# Patient Record
Sex: Female | Born: 1990 | Race: Black or African American | Hispanic: No | Marital: Single | Smoking: Current every day smoker
Health system: Southern US, Community
[De-identification: ages and names within clinical notes are randomized; demographics above are authoritative.]

## PROBLEM LIST (undated history)

## (undated) ENCOUNTER — Inpatient Hospital Stay (HOSPITAL_COMMUNITY): Payer: Self-pay

## (undated) DIAGNOSIS — Z789 Other specified health status: Secondary | ICD-10-CM

## (undated) DIAGNOSIS — Z98891 History of uterine scar from previous surgery: Secondary | ICD-10-CM

## (undated) HISTORY — PX: MOUTH SURGERY: SHX715

---
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
## 2011-09-21 ENCOUNTER — Emergency Department (HOSPITAL_COMMUNITY)
Admission: EM | Admit: 2011-09-21 | Discharge: 2011-09-22 | Disposition: A | Discharge status: Home / Self Care | Payer: BC Managed Care – PPO | Attending: Emergency Medicine | Admitting: Emergency Medicine

## 2011-09-21 ENCOUNTER — Encounter (HOSPITAL_COMMUNITY): Payer: Self-pay | Admitting: Emergency Medicine

## 2011-09-21 ENCOUNTER — Emergency Department (HOSPITAL_COMMUNITY): Payer: BC Managed Care – PPO

## 2011-09-21 DIAGNOSIS — S0003XA Contusion of scalp, initial encounter: Secondary | ICD-10-CM | POA: Insufficient documentation

## 2011-09-21 DIAGNOSIS — M79609 Pain in unspecified limb: Secondary | ICD-10-CM | POA: Insufficient documentation

## 2011-09-21 DIAGNOSIS — S60222A Contusion of left hand, initial encounter: Secondary | ICD-10-CM

## 2011-09-21 DIAGNOSIS — R51 Headache: Secondary | ICD-10-CM | POA: Insufficient documentation

## 2011-09-21 DIAGNOSIS — S0083XA Contusion of other part of head, initial encounter: Secondary | ICD-10-CM

## 2011-09-21 DIAGNOSIS — S60229A Contusion of unspecified hand, initial encounter: Secondary | ICD-10-CM | POA: Insufficient documentation

## 2011-09-21 DIAGNOSIS — F17200 Nicotine dependence, unspecified, uncomplicated: Secondary | ICD-10-CM | POA: Insufficient documentation
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
## 2011-09-21 MED ORDER — OXYCODONE-ACETAMINOPHEN 5-325 MG PO TABS
1.0000 | ORAL_TABLET | Freq: Once | ORAL | Status: AC
  Administered 2011-09-21: 1 via ORAL
  Filled 2011-09-21: qty 1

## 2011-09-21 MED ORDER — IBUPROFEN 200 MG PO TABS
400.0000 mg | ORAL_TABLET | Freq: Once | ORAL | Status: AC
  Administered 2011-09-21: 400 mg via ORAL
  Filled 2011-09-21: qty 2
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
## 2012-01-16 ENCOUNTER — Inpatient Hospital Stay (HOSPITAL_COMMUNITY)
Admission: AD | Admit: 2012-01-16 | Discharge: 2012-01-16 | Disposition: A | Discharge status: Home / Self Care | Payer: BC Managed Care – PPO | Source: Ambulatory Visit | Attending: Obstetrics and Gynecology | Admitting: Obstetrics and Gynecology

## 2012-01-16 ENCOUNTER — Encounter (HOSPITAL_COMMUNITY): Payer: Self-pay | Admitting: *Deleted

## 2012-01-16 DIAGNOSIS — O0930 Supervision of pregnancy with insufficient antenatal care, unspecified trimester: Secondary | ICD-10-CM | POA: Insufficient documentation

## 2012-01-16 DIAGNOSIS — O98819 Other maternal infectious and parasitic diseases complicating pregnancy, unspecified trimester: Secondary | ICD-10-CM | POA: Insufficient documentation

## 2012-01-16 DIAGNOSIS — Z331 Pregnant state, incidental: Secondary | ICD-10-CM

## 2012-01-16 DIAGNOSIS — A5901 Trichomonal vulvovaginitis: Secondary | ICD-10-CM | POA: Insufficient documentation

## 2012-01-16 DIAGNOSIS — R109 Unspecified abdominal pain: Secondary | ICD-10-CM | POA: Insufficient documentation

## 2012-01-16 HISTORY — DX: Other specified health status: Z78.9

## 2012-01-16 LAB — WET PREP, GENITAL: Clue Cells Wet Prep HPF POC: NONE SEEN

## 2012-01-16 LAB — URINALYSIS, ROUTINE W REFLEX MICROSCOPIC
Nitrite: NEGATIVE
Protein, ur: NEGATIVE mg/dL
Urobilinogen, UA: 0.2 mg/dL (ref 0.0–1.0)

## 2012-01-16 LAB — URINE MICROSCOPIC-ADD ON

## 2012-01-16 MED ORDER — METRONIDAZOLE 500 MG PO TABS
2000.0000 mg | ORAL_TABLET | Freq: Once | ORAL | Status: AC
  Administered 2012-01-16: 2000 mg via ORAL
  Filled 2012-01-16: qty 4

## 2012-01-16 MED ORDER — ONDANSETRON HCL 4 MG PO TABS
8.0000 mg | ORAL_TABLET | Freq: Once | ORAL | Status: AC
  Administered 2012-01-16: 8 mg via ORAL
  Filled 2012-01-16: qty 2
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
## 2012-01-16 NOTE — MAU Note (Signed)
Pt states, " While I was at work tonight I started having sharp pain in my lower back. I was cramping in my low abdomen but that pain has stopped."

## 2012-01-16 NOTE — MAU Note (Signed)
Pt in c/o lower abdominal back pains that are sharp and started around 6 pm.  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  Reports a clear discharge yesterday, none today.  Denies any bleeding.  Has 1st appt with Dr Senaida Ores this week.

## 2012-01-17 NOTE — MAU Provider Note (Signed)
Agree with above note.  Felicia Hicks 01/17/2012 7:46 AM   

## 2012-01-19 LAB — GC/CHLAMYDIA PROBE AMP, GENITAL: Chlamydia, DNA Probe: NEGATIVE

## 2012-02-09 LAB — OB RESULTS CONSOLE ANTIBODY SCREEN: Antibody Screen: NEGATIVE

## 2012-05-11 LAB — OB RESULTS CONSOLE GBS: GBS: NEGATIVE

## 2012-06-08 ENCOUNTER — Encounter (HOSPITAL_COMMUNITY): Payer: Self-pay | Admitting: Anesthesiology

## 2012-06-08 ENCOUNTER — Inpatient Hospital Stay (HOSPITAL_COMMUNITY)
Admission: AD | Admit: 2012-06-08 | Discharge: 2012-06-08 | Disposition: A | Discharge status: Home / Self Care | Payer: BC Managed Care – PPO | Source: Ambulatory Visit | Attending: Obstetrics and Gynecology | Admitting: Obstetrics and Gynecology

## 2012-06-08 ENCOUNTER — Encounter (HOSPITAL_COMMUNITY): Payer: Self-pay

## 2012-06-08 ENCOUNTER — Encounter (HOSPITAL_COMMUNITY): Payer: Self-pay | Admitting: *Deleted

## 2012-06-08 ENCOUNTER — Inpatient Hospital Stay (HOSPITAL_COMMUNITY)
Admission: AD | Admit: 2012-06-08 | Discharge: 2012-06-11 | DRG: 371 | Disposition: A | Discharge status: Home / Self Care | Payer: BC Managed Care – PPO | Source: Ambulatory Visit | Attending: Obstetrics and Gynecology | Admitting: Obstetrics and Gynecology

## 2012-06-08 DIAGNOSIS — O339 Maternal care for disproportion, unspecified: Secondary | ICD-10-CM | POA: Diagnosis present

## 2012-06-08 DIAGNOSIS — Z98891 History of uterine scar from previous surgery: Secondary | ICD-10-CM

## 2012-06-08 DIAGNOSIS — O479 False labor, unspecified: Secondary | ICD-10-CM | POA: Insufficient documentation

## 2012-06-08 DIAGNOSIS — O330 Maternal care for disproportion due to deformity of maternal pelvic bones: Principal | ICD-10-CM | POA: Diagnosis present

## 2012-06-08 HISTORY — DX: History of uterine scar from previous surgery: Z98.891

## 2012-06-08 LAB — TYPE AND SCREEN
ABO/RH(D): AB POS
Antibody Screen: NEGATIVE

## 2012-06-08 LAB — CBC
Platelets: 340 10*3/uL (ref 150–400)
RDW: 14.3 % (ref 11.5–15.5)
WBC: 25.9 10*3/uL — ABNORMAL HIGH (ref 4.0–10.5)

## 2012-06-08 MED ORDER — OXYCODONE-ACETAMINOPHEN 5-325 MG PO TABS: 1.0000 | ORAL_TABLET | ORAL | Status: DC | PRN

## 2012-06-08 MED ORDER — EPHEDRINE 5 MG/ML INJ
INTRAVENOUS | Status: AC
  Filled 2012-06-08: qty 4

## 2012-06-08 MED ORDER — CITRIC ACID-SODIUM CITRATE 334-500 MG/5ML PO SOLN
30.0000 mL | ORAL | Status: DC | PRN
  Administered 2012-06-09: 30 mL via ORAL
  Filled 2012-06-08: qty 15

## 2012-06-08 MED ORDER — OXYTOCIN 40 UNITS IN LACTATED RINGERS INFUSION - SIMPLE MED
1.0000 m[IU]/min | INTRAVENOUS | Status: DC
  Administered 2012-06-08: 1 m[IU]/min via INTRAVENOUS

## 2012-06-08 MED ORDER — FLEET ENEMA 7-19 GM/118ML RE ENEM: 1.0000 | ENEMA | RECTAL | Status: DC | PRN

## 2012-06-08 MED ORDER — PHENYLEPHRINE 40 MCG/ML (10ML) SYRINGE FOR IV PUSH (FOR BLOOD PRESSURE SUPPORT)
PREFILLED_SYRINGE | INTRAVENOUS | Status: AC
  Filled 2012-06-08: qty 5

## 2012-06-08 MED ORDER — EPHEDRINE 5 MG/ML INJ: 10.0000 mg | INTRAVENOUS | Status: DC | PRN

## 2012-06-08 MED ORDER — LIDOCAINE HCL (PF) 1 % IJ SOLN
INTRAMUSCULAR | Status: DC | PRN
  Administered 2012-06-08 (×2): 4 mL

## 2012-06-08 MED ORDER — OXYTOCIN 40 UNITS IN LACTATED RINGERS INFUSION - SIMPLE MED
62.5000 mL/h | INTRAVENOUS | Status: DC
  Filled 2012-06-08: qty 1000

## 2012-06-08 MED ORDER — LIDOCAINE HCL (PF) 1 % IJ SOLN
30.0000 mL | INTRAMUSCULAR | Status: DC | PRN
  Filled 2012-06-08: qty 30

## 2012-06-08 MED ORDER — FENTANYL 2.5 MCG/ML BUPIVACAINE 1/10 % EPIDURAL INFUSION (WH - ANES)
INTRAMUSCULAR | Status: AC
  Filled 2012-06-08: qty 125

## 2012-06-08 MED ORDER — LACTATED RINGERS IV SOLN: 500.0000 mL | INTRAVENOUS | Status: DC | PRN

## 2012-06-08 MED ORDER — PHENYLEPHRINE 40 MCG/ML (10ML) SYRINGE FOR IV PUSH (FOR BLOOD PRESSURE SUPPORT): 80.0000 ug | PREFILLED_SYRINGE | INTRAVENOUS | Status: DC | PRN

## 2012-06-08 MED ORDER — ONDANSETRON HCL 4 MG/2ML IJ SOLN
4.0000 mg | Freq: Four times a day (QID) | INTRAMUSCULAR | Status: DC | PRN
  Administered 2012-06-08: 4 mg via INTRAVENOUS
  Filled 2012-06-08: qty 2

## 2012-06-08 MED ORDER — ACETAMINOPHEN 325 MG PO TABS: 650.0000 mg | ORAL_TABLET | ORAL | Status: DC | PRN

## 2012-06-08 MED ORDER — IBUPROFEN 600 MG PO TABS: 600.0000 mg | ORAL_TABLET | Freq: Four times a day (QID) | ORAL | Status: DC | PRN

## 2012-06-08 MED ORDER — FENTANYL 2.5 MCG/ML BUPIVACAINE 1/10 % EPIDURAL INFUSION (WH - ANES)
INTRAMUSCULAR | Status: DC | PRN
  Administered 2012-06-08: 12 mL/h via EPIDURAL

## 2012-06-08 MED ORDER — LACTATED RINGERS IV SOLN
INTRAVENOUS | Status: DC
  Administered 2012-06-08 (×2): via INTRAVENOUS

## 2012-06-08 MED ORDER — OXYTOCIN BOLUS FROM INFUSION: 500.0000 mL | INTRAVENOUS | Status: DC

## 2012-06-08 MED ORDER — DIPHENHYDRAMINE HCL 50 MG/ML IJ SOLN: 12.5000 mg | INTRAMUSCULAR | Status: DC | PRN

## 2012-06-08 MED ORDER — FENTANYL 2.5 MCG/ML BUPIVACAINE 1/10 % EPIDURAL INFUSION (WH - ANES): 14.0000 mL/h | INTRAMUSCULAR | Status: DC

## 2012-06-08 MED ORDER — LACTATED RINGERS IV SOLN
500.0000 mL | Freq: Once | INTRAVENOUS | Status: AC
  Administered 2012-06-08: 500 mL via INTRAVENOUS

## 2012-06-08 MED ORDER — TERBUTALINE SULFATE 1 MG/ML IJ SOLN: 0.2500 mg | Freq: Once | INTRAMUSCULAR | Status: AC | PRN

## 2012-06-08 NOTE — Progress Notes (Signed)
Patient ID: Felicia Hicks, female   DOB: 05/21/1991, 21 y.o.   MRN: [id number]  Pt has quit contracting. The cervix is 5-6 cm dilated around a bulging BOW that ruptured spontaneously during the exam The fluid was meconium stained The vertex is at -2/-3 station The pelvis feels narrow. The FHR decelerated after ROM but recovered with positioning. Will start pitocin if contractions do not begin

## 2012-06-08 NOTE — Progress Notes (Signed)
Patient ID: Felicia Hicks, female   DOB: 04/21/1991, 21 y.o.   MRN: [id number] Pitocin at 19 mu/ minute and the cervix is almost fully dilated. There is only a small anterior lip and the vertex has descended to a 0 station. The pt has begun to feel some pressure.

## 2012-06-08 NOTE — Plan of Care (Signed)
Problem: Consults Goal: Birthing Suites Patient Information Press F2 to bring up selections list Outcome: Completed/Met Date Met:  06/08/12  Pt 37-[redacted] weeks EGA     

## 2012-06-08 NOTE — Anesthesia Procedure Notes (Signed)
Epidural Patient location during procedure: OB Start time: 06/08/2012 5:38 PM  Staffing Anesthesiologist: Acheron Sugg A. Performed by: anesthesiologist   Preanesthetic Checklist Completed: patient identified, site marked, surgical consent, pre-op evaluation, timeout performed, IV checked, risks and benefits discussed and monitors and equipment checked  Epidural Patient position: sitting Prep: site prepped and draped and DuraPrep Patient monitoring: continuous pulse ox and blood pressure Approach: midline Injection technique: LOR air  Needle:  Needle type: Tuohy  Needle gauge: 17 G Needle length: 9 cm and 9 Needle insertion depth: 8 cm Catheter type: closed end flexible Catheter size: 19 Gauge Catheter at skin depth: 14 cm Test dose: negative and Other  Assessment Events: blood not aspirated, injection not painful, no injection resistance, negative IV test and no paresthesia  Additional Notes Patient identified. Risks and benefits discussed including failed block, incomplete  Pain control, post dural puncture headache, nerve damage, paralysis, blood pressure Changes, nausea, vomiting, reactions to medications-both toxic and allergic and post Partum back pain. All questions were answered. Patient expressed understanding and wished to proceed. Sterile technique was used throughout procedure. Epidural site was Dressed with sterile barrier dressing. No paresthesias, signs of intravascular injection Or signs of intrathecal spread were encountered.  Patient was more comfortable after the epidural was dosed. Please see RN's note for documentation of vital signs and FHR which are stable.

## 2012-06-08 NOTE — Anesthesia Preprocedure Evaluation (Addendum)
Anesthesia Evaluation  Patient identified by MRN, date of birth, ID band Patient awake    Reviewed: Allergy & Precautions, H&P , Patient's Chart, lab work & pertinent test results  Airway Mallampati: III TM Distance: >3 FB Neck ROM: full    Dental No notable dental hx. (+) Teeth Intact   Pulmonary neg pulmonary ROS,  breath sounds clear to auscultation  Pulmonary exam normal       Cardiovascular + Orthopnea negative cardio ROS  Rhythm:regular Rate:Normal     Neuro/Psych negative neurological ROS  negative psych ROS   GI/Hepatic negative GI ROS, Neg liver ROS,   Endo/Other  Morbid obesity  Renal/GU negative Renal ROS  negative genitourinary   Musculoskeletal   Abdominal Normal abdominal exam  (+)   Peds  Hematology negative hematology ROS (+)   Anesthesia Other Findings   Reproductive/Obstetrics (+) Pregnancy                          Anesthesia Physical Anesthesia Plan  ASA: III  Anesthesia Plan: Epidural   Post-op Pain Management:    Induction:   Airway Management Planned:   Additional Equipment:   Intra-op Plan:   Post-operative Plan:   Informed Consent: I have reviewed the patients History and Physical, chart, labs and discussed the procedure including the risks, benefits and alternatives for the proposed anesthesia with the patient or authorized representative who has indicated his/her understanding and acceptance.   Dental advisory given  Plan Discussed with: Anesthesiologist  Anesthesia Plan Comments:        Anesthesia Quick Evaluation

## 2012-06-08 NOTE — MAU Note (Signed)
Pt states that contractions started around 0900 and have progressively gotten closer together and more painful. States some vaginal discharge as well.

## 2012-06-08 NOTE — Progress Notes (Signed)
Patient ID: Felicia Hicks, female   DOB: 21-May-1991, 21 y.o.   MRN: [id number] The pitocin is at 15 mu/ minute and her contractions are q 2-3 minutes. The cervix is 8 cm 90% effaced and the vertex is at - 2 station.

## 2012-06-08 NOTE — Progress Notes (Signed)
Patient ID: Felicia Hicks, female   DOB: 07-Jul-1990, 21 y.o.   MRN: [id number] Pt became uncomfortable with contractions and asked for and received an epidural. The cervix was thought to be 6.5 cm 80% effaced and the vertex at - 1 station by the RN at 16:55. Now, the pitocin is at 11 mu/ minute and the contractions are q 2-3 minutes The cervix is 7 cm 80 % effaced and the vertex is at - 2 station. Will follow for progressive dilatation and descent.

## 2012-06-08 NOTE — Progress Notes (Signed)
EFM has been adjusted.  Patient sits with UCs and traces maternal.

## 2012-06-08 NOTE — Progress Notes (Signed)
Patient ID: Felicia Hicks, female   DOB: May 25, 1991, 21 y.o.   MRN: [id number] The cervix is fully dilated. The vertex remains at 0 station. Have begun pushing and will check progress in 1 hour. If no progress will proceed to c section

## 2012-06-08 NOTE — MAU Note (Signed)
Pt states was here hours ago, since going home has not been able to sit down, feeling a lot of rectal pressure.

## 2012-06-08 NOTE — H&P (Signed)
Felicia Hicks, Felicia Hicks               ACCOUNT NO.:  [id number]  MEDICAL RECORD NO.:  [id number]  LOCATION:  9167                          FACILITY:  [facility]  PHYSICIAN:  Malachi Pro. Ambrose Mantle, M.D. DATE OF BIRTH:  30-May-1991  DATE OF ADMISSION:  06/08/2012 DATE OF DISCHARGE:  06/08/2012                             HISTORY & PHYSICAL   PRESENT ILLNESS:  This is a 21 year old black female, para 0, gravida 1, EDC June 15, 2012, based on an ultrasound at 19 weeks 2 days on January 22, 2012.  She is admitted in early labor.  She had been to the hospital during the night thinking she was in labor.  Cervix did not change.  She returned home, claims she was very uncomfortable, came back and was thought to be 5 cm dilated by the admitting nurse, presenting part was too high to tell the presentation with bulging membranes, but ultrasound on the site by the nurse practitioner showed the vertex to be presenting.  Blood group and type AB positive, negative antibody.  Pap smear normal.  Rubella immune.  RPR nonreactive.  Urine culture less than 10,000 mixed flora.  Hepatitis B surface antigen negative, HIV negative.  First trimester screen too advanced for screening. Hemoglobin AA, GC and Chlamydia negative.  Cystic fibrosis screening negative.  Quad screen negative.  One hour Glucola 91.  Group B strep negative.  The patient's prenatal course was essentially uncomplicated. Thoughts were given to inducing labor; however, the cervix remained somewhat unfavorable, so induction was not carried out.  PAST MEDICAL HISTORY:  She does have a history of trichomoniasis in July 2013.  She has had wisdom teeth extracted.  ALLERGIES:  She has no known drug allergies.  No latex allergy.  PREGNANCY HISTORY:  Has no pregnancy history.  She is active without formal exercise program.  Former drinker.  Works as a Conservation officer, nature at [employer]. Smoked 1-2 cigarettes a day but quit during the pregnancy.  PHYSICAL EXAMINATION:   VITAL SIGNS:  On admission, temperature 97.7, pulse 113, respirations 20, blood pressure 127/72. HEART:  Normal size and sounds.  No murmurs. LUNGS:  Clear to auscultation. PELVIC:  Fundal height at the last prenatal visit was 39 cm, per the RN exam the cervix is 5 cm dilated, a 100% effaced, vertex is high with bulging membranes.  The presenting part is confirmed as vertex by ultrasound by the nurse practitioner.  ADMITTING IMPRESSION:  Intrauterine pregnancy at 39 weeks, active labor. The patient is admitted for observation and management of labor.     Malachi Pro. Ambrose Mantle, M.D.     TFH/MEDQ  D:  06/08/2012  T:  06/08/2012  Job:  [id number]

## 2012-06-09 ENCOUNTER — Inpatient Hospital Stay (HOSPITAL_COMMUNITY): Payer: BC Managed Care – PPO | Admitting: Anesthesiology

## 2012-06-09 ENCOUNTER — Encounter (HOSPITAL_COMMUNITY): Payer: Self-pay | Admitting: *Deleted

## 2012-06-09 ENCOUNTER — Encounter (HOSPITAL_COMMUNITY)
Admission: AD | Disposition: A | Discharge status: Home / Self Care | Payer: Self-pay | Source: Ambulatory Visit | Attending: Obstetrics and Gynecology

## 2012-06-09 ENCOUNTER — Encounter (HOSPITAL_COMMUNITY): Payer: Self-pay | Admitting: Anesthesiology

## 2012-06-09 SURGERY — Surgical Case
Anesthesia: Epidural | Site: Abdomen | Wound class: Clean Contaminated

## 2012-06-09 MED ORDER — MEASLES, MUMPS & RUBELLA VAC ~~LOC~~ INJ
0.5000 mL | INJECTION | Freq: Once | SUBCUTANEOUS | Status: DC
  Filled 2012-06-09: qty 0.5

## 2012-06-09 MED ORDER — MEPERIDINE HCL 25 MG/ML IJ SOLN
INTRAMUSCULAR | Status: AC
  Filled 2012-06-09: qty 1

## 2012-06-09 MED ORDER — CEFAZOLIN SODIUM 10 G IJ SOLR
3.0000 g | Freq: Once | INTRAMUSCULAR | Status: AC
  Administered 2012-06-09: 3 g via INTRAVENOUS
  Filled 2012-06-09: qty 3000

## 2012-06-09 MED ORDER — NALOXONE HCL 1 MG/ML IJ SOLN
1.0000 ug/kg/h | INTRAVENOUS | Status: DC | PRN
  Filled 2012-06-09: qty 2

## 2012-06-09 MED ORDER — KETOROLAC TROMETHAMINE 30 MG/ML IJ SOLN: 30.0000 mg | Freq: Four times a day (QID) | INTRAMUSCULAR | Status: AC | PRN

## 2012-06-09 MED ORDER — NALOXONE HCL 0.4 MG/ML IJ SOLN: 0.4000 mg | INTRAMUSCULAR | Status: DC | PRN

## 2012-06-09 MED ORDER — ONDANSETRON HCL 4 MG/2ML IJ SOLN
INTRAMUSCULAR | Status: AC
  Filled 2012-06-09: qty 2

## 2012-06-09 MED ORDER — MORPHINE SULFATE (PF) 0.5 MG/ML IJ SOLN
INTRAMUSCULAR | Status: DC | PRN
  Administered 2012-06-09: 1 mg via INTRAVENOUS

## 2012-06-09 MED ORDER — OXYCODONE-ACETAMINOPHEN 5-325 MG PO TABS: 1.0000 | ORAL_TABLET | ORAL | Status: DC | PRN

## 2012-06-09 MED ORDER — KETOROLAC TROMETHAMINE 60 MG/2ML IM SOLN
INTRAMUSCULAR | Status: AC
  Filled 2012-06-09: qty 2

## 2012-06-09 MED ORDER — HYDROMORPHONE HCL PF 1 MG/ML IJ SOLN: 0.2500 mg | INTRAMUSCULAR | Status: DC | PRN

## 2012-06-09 MED ORDER — ONDANSETRON HCL 4 MG PO TABS: 4.0000 mg | ORAL_TABLET | ORAL | Status: DC | PRN

## 2012-06-09 MED ORDER — ONDANSETRON HCL 4 MG/2ML IJ SOLN: 4.0000 mg | INTRAMUSCULAR | Status: DC | PRN

## 2012-06-09 MED ORDER — LACTATED RINGERS IV SOLN
INTRAVENOUS | Status: DC
  Administered 2012-06-09: 16:00:00 via INTRAVENOUS

## 2012-06-09 MED ORDER — METOCLOPRAMIDE HCL 5 MG/ML IJ SOLN: 10.0000 mg | Freq: Three times a day (TID) | INTRAMUSCULAR | Status: DC | PRN

## 2012-06-09 MED ORDER — PRENATAL MULTIVITAMIN CH
1.0000 | ORAL_TABLET | Freq: Every day | ORAL | Status: DC
  Administered 2012-06-09 – 2012-06-11 (×3): 1 via ORAL
  Filled 2012-06-09 (×3): qty 1

## 2012-06-09 MED ORDER — LACTATED RINGERS IV SOLN
INTRAVENOUS | Status: DC | PRN
  Administered 2012-06-09: 02:00:00 via INTRAVENOUS

## 2012-06-09 MED ORDER — SIMETHICONE 80 MG PO CHEW: 80.0000 mg | CHEWABLE_TABLET | ORAL | Status: DC | PRN

## 2012-06-09 MED ORDER — SODIUM BICARBONATE 8.4 % IV SOLN
INTRAVENOUS | Status: AC
  Filled 2012-06-09: qty 50

## 2012-06-09 MED ORDER — CEFAZOLIN SODIUM-DEXTROSE 2-3 GM-% IV SOLR
2.0000 g | Freq: Three times a day (TID) | INTRAVENOUS | Status: AC
  Administered 2012-06-09 (×2): 2 g via INTRAVENOUS
  Filled 2012-06-09 (×2): qty 50

## 2012-06-09 MED ORDER — IBUPROFEN 600 MG PO TABS
600.0000 mg | ORAL_TABLET | Freq: Four times a day (QID) | ORAL | Status: DC | PRN
  Administered 2012-06-10: 600 mg via ORAL
  Filled 2012-06-09 (×4): qty 1

## 2012-06-09 MED ORDER — OXYTOCIN 10 UNIT/ML IJ SOLN
INTRAMUSCULAR | Status: AC
  Filled 2012-06-09: qty 4

## 2012-06-09 MED ORDER — DIPHENHYDRAMINE HCL 50 MG/ML IJ SOLN: 12.5000 mg | INTRAMUSCULAR | Status: DC | PRN

## 2012-06-09 MED ORDER — MEPERIDINE HCL 25 MG/ML IJ SOLN
6.2500 mg | INTRAMUSCULAR | Status: DC | PRN
  Administered 2012-06-09: 6.25 mg via INTRAVENOUS

## 2012-06-09 MED ORDER — 0.9 % SODIUM CHLORIDE (POUR BTL) OPTIME
TOPICAL | Status: DC | PRN
  Administered 2012-06-09: 1000 mL

## 2012-06-09 MED ORDER — SENNOSIDES-DOCUSATE SODIUM 8.6-50 MG PO TABS
2.0000 | ORAL_TABLET | Freq: Every day | ORAL | Status: DC
  Administered 2012-06-09 – 2012-06-10 (×2): 2 via ORAL

## 2012-06-09 MED ORDER — OXYTOCIN 40 UNITS IN LACTATED RINGERS INFUSION - SIMPLE MED: 62.5000 mL/h | INTRAVENOUS | Status: AC

## 2012-06-09 MED ORDER — DIBUCAINE 1 % RE OINT: 1.0000 "application " | TOPICAL_OINTMENT | RECTAL | Status: DC | PRN

## 2012-06-09 MED ORDER — MORPHINE SULFATE 0.5 MG/ML IJ SOLN
INTRAMUSCULAR | Status: AC
  Filled 2012-06-09: qty 10

## 2012-06-09 MED ORDER — TETANUS-DIPHTH-ACELL PERTUSSIS 5-2.5-18.5 LF-MCG/0.5 IM SUSP: 0.5000 mL | Freq: Once | INTRAMUSCULAR | Status: DC

## 2012-06-09 MED ORDER — DIPHENHYDRAMINE HCL 50 MG/ML IJ SOLN: 25.0000 mg | INTRAMUSCULAR | Status: DC | PRN

## 2012-06-09 MED ORDER — WITCH HAZEL-GLYCERIN EX PADS: 1.0000 "application " | MEDICATED_PAD | CUTANEOUS | Status: DC | PRN

## 2012-06-09 MED ORDER — MORPHINE SULFATE (PF) 0.5 MG/ML IJ SOLN
INTRAMUSCULAR | Status: DC | PRN
  Administered 2012-06-09: 4 mg via EPIDURAL

## 2012-06-09 MED ORDER — SCOPOLAMINE 1 MG/3DAYS TD PT72
1.0000 | MEDICATED_PATCH | Freq: Once | TRANSDERMAL | Status: DC
  Administered 2012-06-09: 1.5 mg via TRANSDERMAL

## 2012-06-09 MED ORDER — SCOPOLAMINE 1 MG/3DAYS TD PT72
MEDICATED_PATCH | TRANSDERMAL | Status: AC
  Filled 2012-06-09: qty 1

## 2012-06-09 MED ORDER — LANOLIN HYDROUS EX OINT: 1.0000 "application " | TOPICAL_OINTMENT | CUTANEOUS | Status: DC | PRN

## 2012-06-09 MED ORDER — SODIUM CHLORIDE 0.9 % IJ SOLN: 3.0000 mL | INTRAMUSCULAR | Status: DC | PRN

## 2012-06-09 MED ORDER — MEPERIDINE HCL 25 MG/ML IJ SOLN
INTRAMUSCULAR | Status: DC | PRN
  Administered 2012-06-09 (×2): 12.5 mg via INTRAVENOUS

## 2012-06-09 MED ORDER — KETOROLAC TROMETHAMINE 60 MG/2ML IM SOLN
60.0000 mg | Freq: Once | INTRAMUSCULAR | Status: AC | PRN
  Administered 2012-06-09: 60 mg via INTRAMUSCULAR

## 2012-06-09 MED ORDER — IBUPROFEN 600 MG PO TABS
600.0000 mg | ORAL_TABLET | Freq: Four times a day (QID) | ORAL | Status: DC
  Administered 2012-06-09 – 2012-06-11 (×8): 600 mg via ORAL
  Filled 2012-06-09 (×5): qty 1

## 2012-06-09 MED ORDER — MENTHOL 3 MG MT LOZG: 1.0000 | LOZENGE | OROMUCOSAL | Status: DC | PRN

## 2012-06-09 MED ORDER — SODIUM BICARBONATE 8.4 % IV SOLN
INTRAVENOUS | Status: DC | PRN
  Administered 2012-06-09 (×2): 5 mL via EPIDURAL

## 2012-06-09 MED ORDER — SIMETHICONE 80 MG PO CHEW
80.0000 mg | CHEWABLE_TABLET | Freq: Three times a day (TID) | ORAL | Status: DC
  Administered 2012-06-09 – 2012-06-11 (×10): 80 mg via ORAL

## 2012-06-09 MED ORDER — ZOLPIDEM TARTRATE 5 MG PO TABS: 5.0000 mg | ORAL_TABLET | Freq: Every evening | ORAL | Status: DC | PRN

## 2012-06-09 MED ORDER — DIPHENHYDRAMINE HCL 25 MG PO CAPS: 25.0000 mg | ORAL_CAPSULE | Freq: Four times a day (QID) | ORAL | Status: DC | PRN

## 2012-06-09 MED ORDER — LIDOCAINE-EPINEPHRINE (PF) 2 %-1:200000 IJ SOLN
INTRAMUSCULAR | Status: AC
  Filled 2012-06-09: qty 20

## 2012-06-09 MED ORDER — NALBUPHINE HCL 10 MG/ML IJ SOLN
5.0000 mg | INTRAMUSCULAR | Status: DC | PRN
  Filled 2012-06-09: qty 1

## 2012-06-09 MED ORDER — LACTATED RINGERS IV SOLN
INTRAVENOUS | Status: DC | PRN
  Administered 2012-06-09 (×3): via INTRAVENOUS

## 2012-06-09 MED ORDER — OXYTOCIN 10 UNIT/ML IJ SOLN
40.0000 [IU] | INTRAVENOUS | Status: DC | PRN
  Administered 2012-06-09: 40 [IU] via INTRAVENOUS

## 2012-06-09 MED ORDER — DIPHENHYDRAMINE HCL 25 MG PO CAPS: 25.0000 mg | ORAL_CAPSULE | ORAL | Status: DC | PRN

## 2012-06-09 MED ORDER — ONDANSETRON HCL 4 MG/2ML IJ SOLN
INTRAMUSCULAR | Status: DC | PRN
  Administered 2012-06-09: 4 mg via INTRAVENOUS

## 2012-06-09 MED ORDER — ONDANSETRON HCL 4 MG/2ML IJ SOLN: 4.0000 mg | Freq: Three times a day (TID) | INTRAMUSCULAR | Status: DC | PRN

## 2012-06-09 SURGICAL SUPPLY — 35 items
CLOTH BEACON ORANGE TIMEOUT ST (SAFETY) ×2 IMPLANT
CONTAINER PREFILL 10% NBF 15ML (MISCELLANEOUS) IMPLANT
DRAPE LG THREE QUARTER DISP (DRAPES) IMPLANT
DRESSING TELFA 8X3 (GAUZE/BANDAGES/DRESSINGS) IMPLANT
DRSG OPSITE POSTOP 4X10 (GAUZE/BANDAGES/DRESSINGS) ×2 IMPLANT
DRSG VASELINE 3X18 (GAUZE/BANDAGES/DRESSINGS) ×2 IMPLANT
DURAPREP 26ML APPLICATOR (WOUND CARE) ×2 IMPLANT
ELECT REM PT RETURN 9FT ADLT (ELECTROSURGICAL) ×2
ELECTRODE REM PT RTRN 9FT ADLT (ELECTROSURGICAL) ×1 IMPLANT
EXTRACTOR VACUUM KIWI (MISCELLANEOUS) IMPLANT
EXTRACTOR VACUUM M CUP 4 TUBE (SUCTIONS) IMPLANT
GAUZE SPONGE 4X4 12PLY STRL LF (GAUZE/BANDAGES/DRESSINGS) IMPLANT
GLOVE BIO SURGEON STRL SZ7.5 (GLOVE) ×4 IMPLANT
GOWN PREVENTION PLUS LG XLONG (DISPOSABLE) ×4 IMPLANT
GOWN PREVENTION PLUS XLARGE (GOWN DISPOSABLE) ×2 IMPLANT
KIT ABG SYR 3ML LUER SLIP (SYRINGE) IMPLANT
NEEDLE HYPO 25X5/8 SAFETYGLIDE (NEEDLE) IMPLANT
NS IRRIG 1000ML POUR BTL (IV SOLUTION) ×2 IMPLANT
PACK C SECTION WH (CUSTOM PROCEDURE TRAY) ×2 IMPLANT
PAD ABD 7.5X8 STRL (GAUZE/BANDAGES/DRESSINGS) IMPLANT
PAD OB MATERNITY 4.3X12.25 (PERSONAL CARE ITEMS) ×2 IMPLANT
RTRCTR C-SECT PINK 25CM LRG (MISCELLANEOUS) ×2 IMPLANT
SLEEVE SCD COMPRESS KNEE LRG (MISCELLANEOUS) ×2 IMPLANT
SLEEVE SCD COMPRESS KNEE MED (MISCELLANEOUS) IMPLANT
STAPLER VISISTAT 35W (STAPLE) ×2 IMPLANT
SUT PLAIN 0 NONE (SUTURE) IMPLANT
SUT VIC AB 0 CT1 36 (SUTURE) ×12 IMPLANT
SUT VIC AB 3-0 CTX 36 (SUTURE) ×2 IMPLANT
SUT VIC AB 3-0 SH 27 (SUTURE)
SUT VIC AB 3-0 SH 27X BRD (SUTURE) IMPLANT
SUT VIC AB 4-0 KS 27 (SUTURE) IMPLANT
SUT VICRYL 0 TIES 12 18 (SUTURE) IMPLANT
TOWEL OR 17X24 6PK STRL BLUE (TOWEL DISPOSABLE) ×6 IMPLANT
TRAY FOLEY CATH 14FR (SET/KITS/TRAYS/PACK) IMPLANT
WATER STERILE IRR 1000ML POUR (IV SOLUTION) ×2 IMPLANT

## 2012-06-09 NOTE — Progress Notes (Signed)
Patient ID: Felicia Hicks, female   DOB: 10/19/1990, 21 y.o.   MRN: [id number] DOS afebrile out OK but urine is concentrated.

## 2012-06-09 NOTE — Addendum Note (Signed)
Addendum  created 06/09/12 0953 by Elbert Ewings, CRNA   Modules edited:Charges VN

## 2012-06-09 NOTE — Addendum Note (Signed)
Addendum  created 06/09/12 2208 by Graciela Husbands, CRNA   Modules edited:Notes Section

## 2012-06-09 NOTE — Progress Notes (Signed)
Patient ID: Felicia Hicks, female   DOB: July 05, 1990, 21 y.o.   MRN: [id number] The exam is unchanged after an hour of pushing. Will proceed with c section for relative CPD.

## 2012-06-09 NOTE — Progress Notes (Signed)
C/S called. Pt educated; verbalized understanding; consent signed by pt and MD.

## 2012-06-09 NOTE — Addendum Note (Signed)
Addendum  created 06/09/12 1610 by Elbert Ewings, CRNA   Modules edited:Notes Section

## 2012-06-09 NOTE — Transfer of Care (Signed)
Immediate Anesthesia Transfer of Care Note  Patient: Felicia Hicks  Procedure(s) Performed: Procedure(s) (LRB) with comments: CESAREAN SECTION (N/A) - Primary Cesarean Section Delivery Baby Boy @ [phone], Apgars 8/9  Patient Location: PACU  Anesthesia Type:Epidural  Level of Consciousness: awake  Airway & Oxygen Therapy: Patient Spontanous Breathing  Post-op Assessment: Report given to PACU RN and Post -op Vital signs reviewed and stable  Post vital signs: stable  Complications: No apparent anesthesia complications

## 2012-06-09 NOTE — Op Note (Signed)
Operative note on Felicia Hicks:  Date of the operation: 06/09/2012  Preoperative diagnosis: Intrauterine pregnancy at term relative cephalopelvic disproportion  Postoperative diagnosis: Same  Operation: Low transverse cervical C-section  Operator: Shaylyn Bawa  Anesthesia: Dr. Cristela Blue  The patient was brought to the operating room and the epidural was boosted. The fetal heart tones were normal. A Foley catheter was inserted. The abdomen was prepped with a DuraPrep. A timeout was done.The abdomen was draped as a sterile field.anesthesia was confirmed by pinching the lower abdomen with an Allis clamp. A marking pen had been used to outline my incision in the skin before the abdomen was prepped. A transverse incision was made through the skin and subcutaneous tissue. There was some large bleeders in the subcutaneous tissue that were treated with the Bovie. Because of the patient's size, 5 feet 1 inches and 280 pounds the subcutaneous tissue was thick. I reached the fascia and incised transversely encountering a couple bleeders that were treated with the Bovie. The fascia was separated from the rectus muscle superiorly and inferiorly. The peritoneum was identified and opened sharply. It was stretched to allow the placement of a retractor,the Alexis retractor. I made a short transverse incision through the superficial layers of the myometrium in the lower uterine segment, then entered the amniotic sac with my finger releasing a large amount of meconium-stained fluid. The infant's vertex was elevated into the incisional opening without difficulty and the nose and pharynx were suctioned with the bulb.After the suctioning the female infant was given to the neonatologist Dr. Dorene Grebe who was in attendance.he assigned Apgars of 8 and 9 at one and 5 minutes. The placenta was removed. It was significantly calcified. The inside of the uterus was palpated and found to be free of debris. The uterus was quite boggy  and I held it between my hands for several minutes to try to get some tone along with Pitocin. The tubes and ovaries and uterus were normal. I closed the uterine incision with 2 running sutures of 0 Vicryl locking the first layer nonlocking on the second layer.liberal irrigation confirmed hemostasis. I removed the retractor and closed the abdominal wall in layers using interrupted sutures of 0 Vicryl to close the rectus muscle and peritoneum in one layer, 2 running sutures of 0 Vicryl on the fascia, a running 3-0 Vicryl on the subcutaneous tissue and staples on the skin.A sterile dressing was applied and the procedure was terminated estimated blood loss one thousand cc's

## 2012-06-09 NOTE — Anesthesia Postprocedure Evaluation (Signed)
  Anesthesia Post-op Note  Patient: Felicia Hicks  Procedure(s) Performed: Procedure(s) (LRB) with comments: CESAREAN SECTION (N/A) - Primary Cesarean Section Delivery Baby Boy @ [phone], Apgars 8/9  Patient Location: Mother/Baby  Anesthesia Type:Epidural  Level of Consciousness: awake, alert  and oriented  Airway and Oxygen Therapy: Patient Spontanous Breathing  Post-op Pain: mild  Post-op Assessment: Post-op Vital signs reviewed, Patient's Cardiovascular Status Stable, No headache, No backache, No residual numbness and No residual motor weakness  Post-op Vital Signs: Reviewed and stable  Complications: No apparent anesthesia complications

## 2012-06-09 NOTE — Anesthesia Postprocedure Evaluation (Signed)
  Anesthesia Post-op Note  Patient: Felicia Hicks  Procedure(s) Performed: Procedure(s) (LRB) with comments: CESAREAN SECTION (N/A) - Primary Cesarean Section Delivery Baby Boy @ [phone], Apgars 8/9   Patient is awake, responsive, moving her legs, and has signs of resolution of her numbness. Pain and nausea are reasonably well controlled. Vital signs are stable and clinically acceptable. Oxygen saturation is clinically acceptable. There are no apparent anesthetic complications at this time. Patient is ready for discharge.

## 2012-06-09 NOTE — Anesthesia Postprocedure Evaluation (Signed)
  Anesthesia Post-op Note  Patient: Felicia Hicks  Procedure(s) Performed: Procedure(s) (LRB) with comments: CESAREAN SECTION (N/A) - Primary Cesarean Section Delivery Baby Boy @ [phone], Apgars 8/9  Patient Location: PACU and Mother/Baby  Anesthesia Type:Epidural  Level of Consciousness: awake, alert  and oriented  Airway and Oxygen Therapy: Patient Spontanous Breathing  Post-op Pain: mild  Post-op Assessment: Patient's Cardiovascular Status Stable, Respiratory Function Stable, No signs of Nausea or vomiting, Adequate PO intake, Pain level controlled, No headache, No backache, No residual numbness and No residual motor weakness  Post-op Vital Signs: stable  Complications: No apparent anesthesia complications

## 2012-06-10 ENCOUNTER — Encounter (HOSPITAL_COMMUNITY): Payer: Self-pay | Admitting: Obstetrics and Gynecology

## 2012-06-10 LAB — CBC
HCT: 28.1 % — ABNORMAL LOW (ref 36.0–46.0)
Hemoglobin: 9 g/dL — ABNORMAL LOW (ref 12.0–15.0)
MCH: 27.9 pg (ref 26.0–34.0)
MCHC: 32 g/dL (ref 30.0–36.0)
MCV: 87 fL (ref 78.0–100.0)
Platelets: 278 K/uL (ref 150–400)
RBC: 3.23 MIL/uL — ABNORMAL LOW (ref 3.87–5.11)
RDW: 14.5 % (ref 11.5–15.5)
WBC: 21.1 K/uL — ABNORMAL HIGH (ref 4.0–10.5)

## 2012-06-10 NOTE — Progress Notes (Signed)
Patient ID: Felicia Hicks, female   DOB: 1991/06/21, 21 y.o.   MRN: [id number] #1 afebrile BP normal Passing flatus, voiding well, ambulating and tolerating a diet.

## 2012-06-11 ENCOUNTER — Encounter (HOSPITAL_COMMUNITY): Payer: Self-pay | Admitting: Obstetrics and Gynecology

## 2012-06-11 DIAGNOSIS — Z98891 History of uterine scar from previous surgery: Secondary | ICD-10-CM

## 2012-06-11 HISTORY — DX: History of uterine scar from previous surgery: Z98.891

## 2012-06-11 MED ORDER — PRENATAL MULTIVITAMIN CH: 1.0000 | ORAL_TABLET | Freq: Every day | ORAL | Status: DC

## 2012-06-11 MED ORDER — OXYCODONE-ACETAMINOPHEN 5-325 MG PO TABS: 1.0000 | ORAL_TABLET | ORAL | Status: DC | PRN

## 2012-06-11 MED ORDER — IBUPROFEN 800 MG PO TABS: 800.0000 mg | ORAL_TABLET | Freq: Three times a day (TID) | ORAL | Status: DC | PRN

## 2012-06-11 NOTE — Clinical Social Work Note (Signed)
CSW consulted with MOB.  No barriers to discharge at this time.  Full consult report to follow.    [phone] 

## 2012-06-11 NOTE — Progress Notes (Signed)
Subjective: Postpartum Day 2: Cesarean Delivery Patient reports incisional pain, tolerating PO and no problems voiding.  Pain controlled, nl lochia  Objective: Vital signs in last 24 hours: Temp:  [97.6 F (36.4 C)-98 F (36.7 C)] 97.7 F (36.5 C) (12/21 0630) Pulse Rate:  [96-99] 96  (12/21 0630) Resp:  [18-20] 18  (12/21 0630) BP: (121-139)/(78-85) 123/78 mmHg (12/21 0630)  Physical Exam:  General: alert and no distress Lochia: appropriate Uterine Fundus: firm Incision: healing well DVT Evaluation: No evidence of DVT seen on physical exam.   Basename 06/10/12 0001 06/08/12 0930  HGB 9.0* 11.4*  HCT 28.1* 35.5*    Assessment/Plan: Status post Cesarean section. Doing well postoperatively.  Discharge home with standard precautions and return to office Thursday for staple removal.  D/C with motrin, percocet, pnv.  BOVARD,Aleighna Wojtas 06/11/2012, 8:37 AM

## 2012-06-11 NOTE — Discharge Summary (Signed)
Obstetric Discharge Summary Reason for Admission: onset of labor Prenatal Procedures: none Intrapartum Procedures: cesarean: low cervical, transverse, CPD Postpartum Procedures: none Complications-Operative and Postpartum: none Hemoglobin  Date Value Range Status  06/10/2012 9.0* 12.0 - 15.0 g/dL Final     DELTA CHECK NOTED     REPEATED TO VERIFY     HCT  Date Value Range Status  06/10/2012 28.1* 36.0 - 46.0 % Final    Physical Exam:  General: alert and no distress Lochia: appropriate Uterine Fundus: firm Incision: healing well DVT Evaluation: No evidence of DVT seen on physical exam.  Discharge Diagnoses: Term Pregnancy-delivered  Discharge Information: Date: 06/11/2012 Activity: pelvic rest Diet: routine Medications: PNV, Ibuprofen and Percocet Condition: stable Instructions: refer to practice specific booklet Discharge to: home Follow-up Information    Schedule an appointment as soon as possible for a visit with Bing Plume, MD. (Thursday Dec 26th - for staple removal)    Contact information:   [address], [address], [address] [place] [address] [phone]          Newborn Data: Live born female  Birth Weight: 7 lb 6.2 oz (3350 g) APGAR: 8, 9  Home with mother.  Felicia Hicks,Felicia Hicks 06/11/2012, 8:43 AM

## 2012-06-12 NOTE — Clinical Social Work Note (Signed)
LATE ENTRY  Clinical Social Work Department PSYCHOSOCIAL ASSESSMENT - MATERNAL/CHILD 06/12/2012  Patient:  Felicia Hicks, Felicia Hicks  Account Number:  [id number]  Admit Date:  06/08/2012  Marjo Bicker Name:   Felicia Hicks    Clinical Social Worker:  Truman Hayward, LCSW   Date/Time:  06/11/2012 09:30 AM  Date Referred:  06/11/2012   Referral source  Physician  RN     Referred reason  Substance Abuse   Other referral source:    I:  FAMILY / HOME ENVIRONMENT Child's legal guardian:  PARENT  Guardian - Name Guardian - Age Guardian - Address  Felicia Hicks [address] [place] [address]  Felicia Hicks     Other household support members/support persons Name Relationship DOB  none     Other support:    II  PSYCHOSOCIAL DATA Information Source:  Patient Interview  Insurance claims handler Resources Employment:   MOB and FOB both employed   Surveyor, quantity resources:  [employer] If [employer] - County:  [employer] / Grade:   Maternity Care Coordinator / Child Services Coordination / Early Interventions:  Cultural issues impacting care:    III  STRENGTHS Strengths  Adequate Resources  Home prepared for Child (including basic supplies)  Supportive family/friends   Strength comment:    IV  RISK FACTORS AND CURRENT PROBLEMS Current Problem:  None   Risk Factor & Current Problem Patient Issue Family Issue Risk Factor / Current Problem Comment   N N     V  SOCIAL WORK ASSESSMENT CSW spoke with MOB and FOB in room.  MOB reports using MJ during pregnancy, however she has not for the last few months.  MOB stated this was due to nausea and is not something she did before the pregnancy.  CSW discussed hospital policy to drug screen and reported UDS results were negative.  CSW explained MEC results and possible follow up from DSS.  MOB was understanding.  CSW discussed supplies and family support.  MOB stated she had no concerns at this time.  Please reconsult CSW if further needs  arise.      VI SOCIAL WORK PLAN Social Work Plan  No Further Intervention Required / No Barriers to Discharge   Type of pt/family education:   If child protective services report - county:   If child protective services report - date:   Information/referral to community resources comment:   Other social work plan:

## 2014-04-23 ENCOUNTER — Encounter (HOSPITAL_COMMUNITY): Payer: Self-pay | Admitting: Obstetrics and Gynecology

## 2014-10-22 LAB — OB RESULTS CONSOLE VARICELLA ZOSTER ANTIBODY, IGG: Varicella: NON-IMMUNE/NOT IMMUNE

## 2014-10-22 LAB — OB RESULTS CONSOLE ANTIBODY SCREEN: ANTIBODY SCREEN: NEGATIVE

## 2014-10-22 LAB — OB RESULTS CONSOLE ABO/RH: RH Type: POSITIVE

## 2014-10-22 LAB — OB RESULTS CONSOLE RPR: RPR: NONREACTIVE

## 2014-11-20 ENCOUNTER — Other Ambulatory Visit (HOSPITAL_COMMUNITY): Payer: Self-pay | Admitting: Urology

## 2014-11-20 DIAGNOSIS — Z3689 Encounter for other specified antenatal screening: Secondary | ICD-10-CM

## 2014-11-26 ENCOUNTER — Other Ambulatory Visit (HOSPITAL_COMMUNITY): Payer: Self-pay | Admitting: Urology

## 2014-11-26 ENCOUNTER — Ambulatory Visit (HOSPITAL_COMMUNITY)
Admission: RE | Admit: 2014-11-26 | Discharge: 2014-11-26 | Disposition: A | Discharge status: Home / Self Care | Payer: BC Managed Care – PPO | Source: Ambulatory Visit | Attending: Urology | Admitting: Urology

## 2014-11-26 DIAGNOSIS — O99213 Obesity complicating pregnancy, third trimester: Secondary | ICD-10-CM | POA: Insufficient documentation

## 2014-11-26 DIAGNOSIS — Z3689 Encounter for other specified antenatal screening: Secondary | ICD-10-CM

## 2014-11-26 DIAGNOSIS — E669 Obesity, unspecified: Secondary | ICD-10-CM | POA: Diagnosis not present

## 2014-11-26 IMAGING — US US OB DETAIL+14 WK
1 series · 12 of 28 positions shown · non-contrast
Comparison: none

[Series 1: us ob +14 all · 118 acquisitions, 12 frames shown]
[im 5/118]
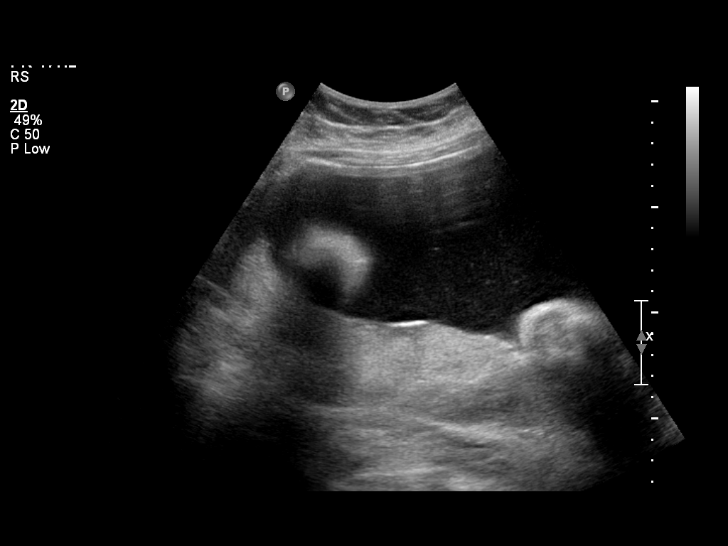
[im 14/118]
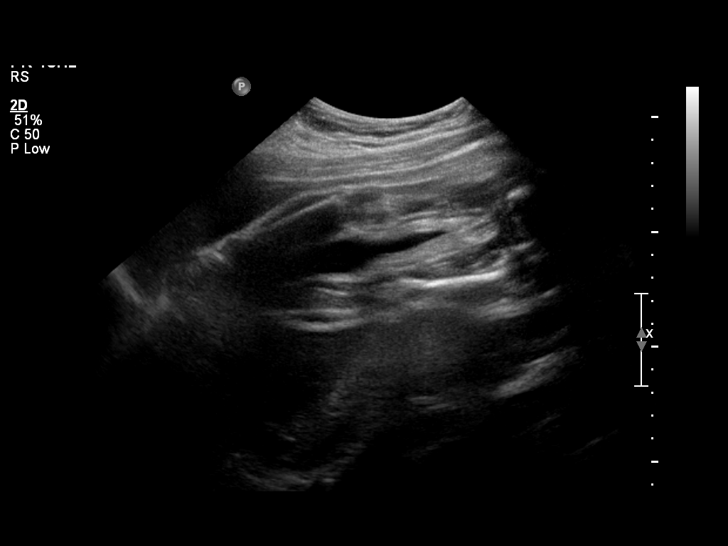
[im 22/118]
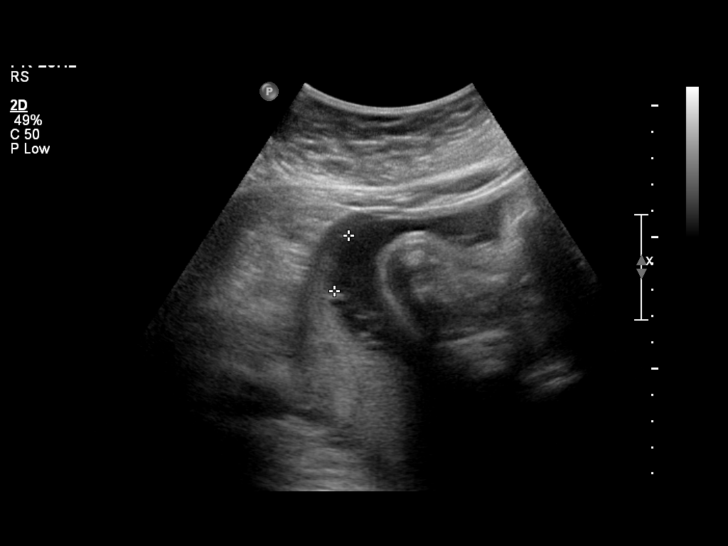
[im 35/118]
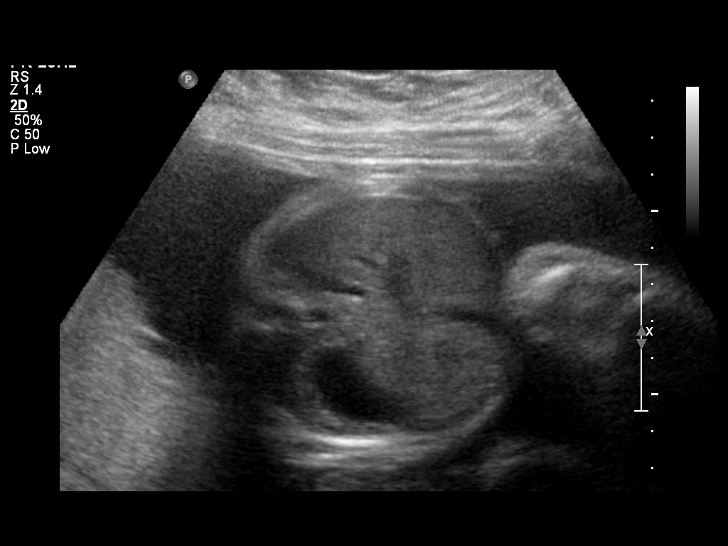
[im 44/118]
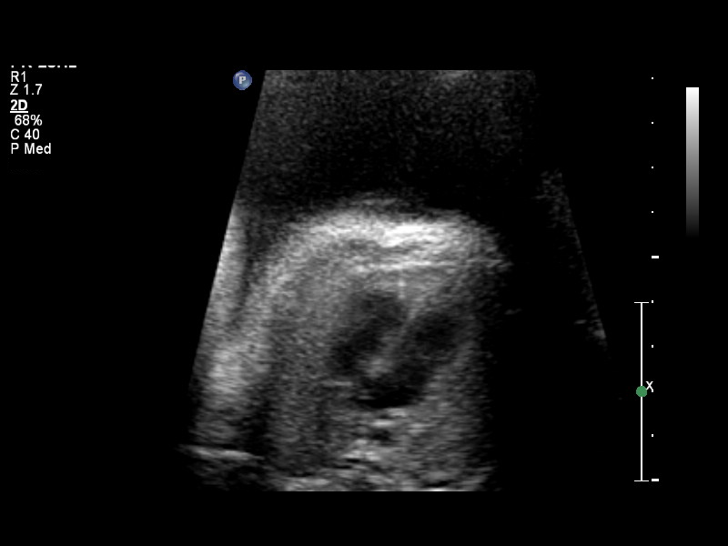
[im 53/118]
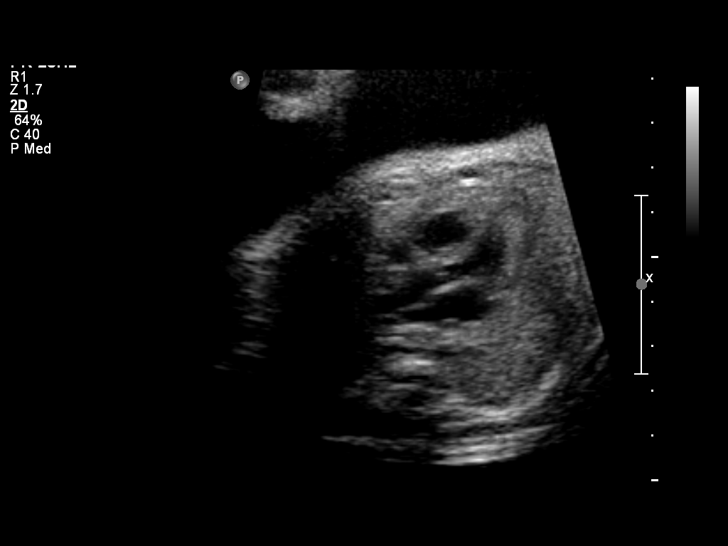
[im 66/118]
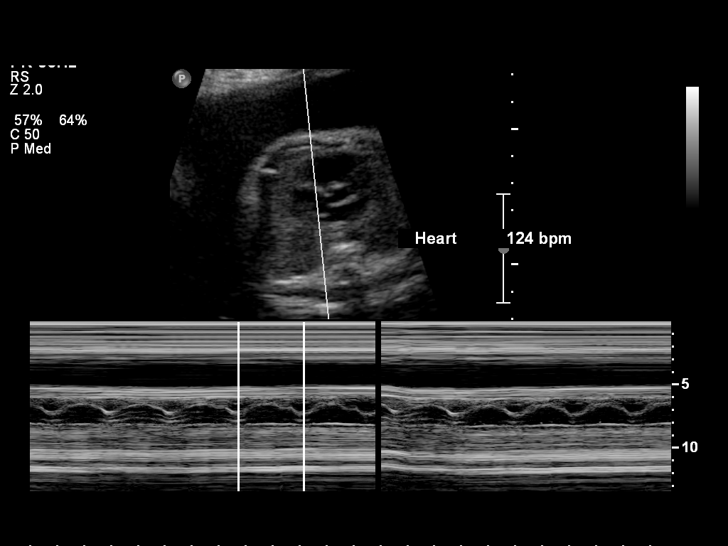
[im 74/118]
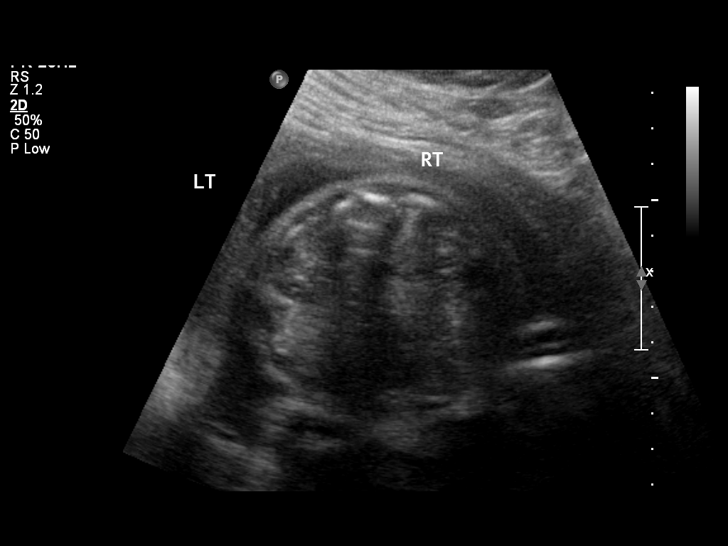
[im 83/118]
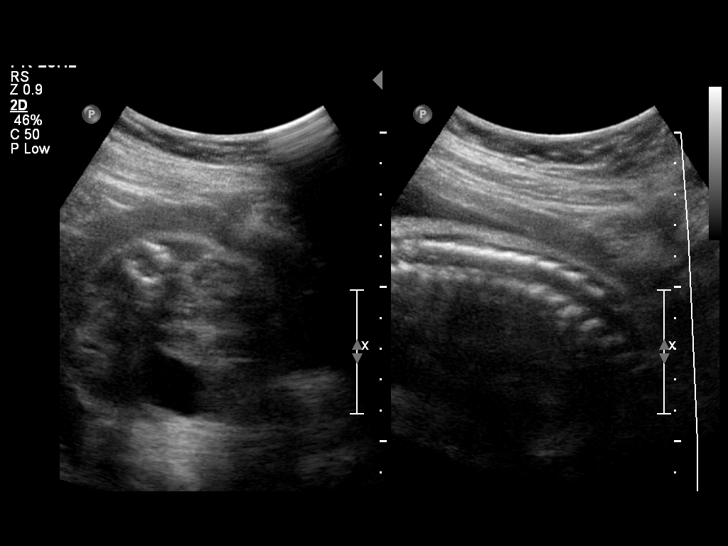
[im 96/118]
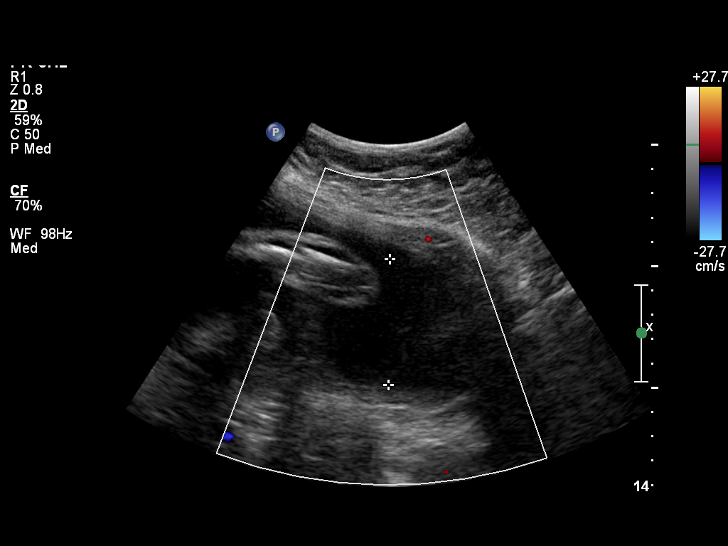
[im 105/118]
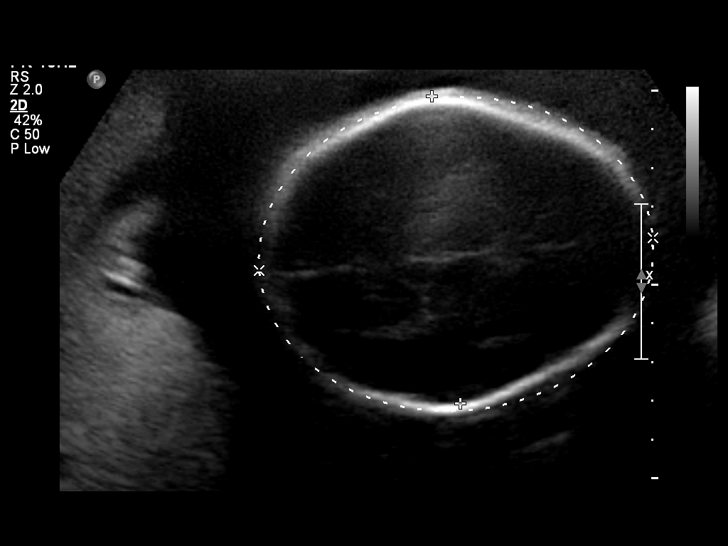
[im 113/118]
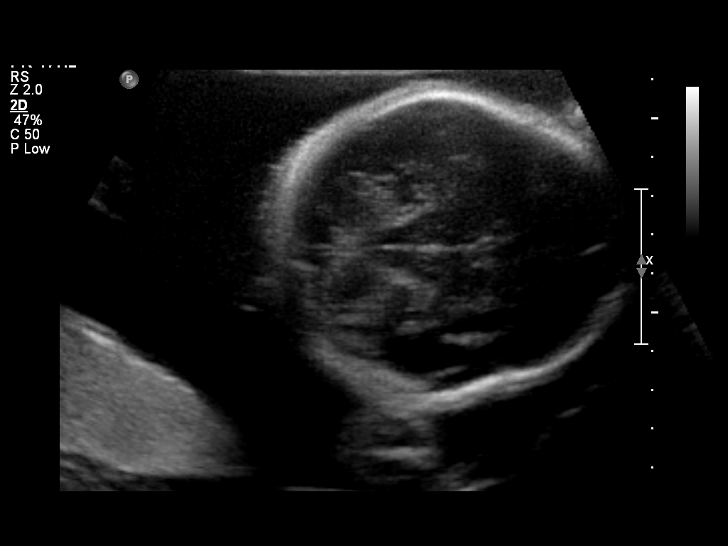

[12 of 28 positions shown; findings below may reference images not displayed]

OBSTETRICS REPORT
(Signed Final 11/27/2014 [DATE])

Faculty Physician
Service(s) Provided

US OB DETAIL + 14 WK                                  76811.0
Indications

30 weeks gestation of pregnancy
Detailed fetal anatomic survey                        Z36
Maternal morbid obesity (BMI 54.03)
Fetal Evaluation

Num Of Fetuses:    1
Fetal Heart Rate:  124                          bpm
Cardiac Activity:  Observed
Presentation:      Variable
Placenta:          Posterior Fundal, above
cervical os
P. Cord            Visualized, central
Insertion:

Amniotic Fluid
AFI FV:      Subjectively within normal limits
AFI Sum:     21.17   cm       83  %Tile     Larg Pckt:    6.42  cm
RUQ:   4.37    cm   RLQ:    5.22   cm    LUQ:   6.42    cm   LLQ:    5.16   cm
Biometry

BPD:     79.3  mm     G. Age:  31w 6d                CI:         78.8   70 - 86
OFD:    100.6  mm                                    FL/HC:      17.5   19.2 -
21.4
HC:     288.8  mm     G. Age:  31w 5d       57  %    HC/AC:      1.13   0.99 -
1.21
AC:     256.1  mm     G. Age:  29w 6d       31  %    FL/BPD:     63.6   71 - 87
FL:      50.4  mm     G. Age:  27w 0d      < 3  %    FL/AC:      19.7   20 - 24
HUM:     45.2  mm     G. Age:  26w 5d      < 5  %
CER:     37.9  mm     G. Age:  32w 3d       79  %

Est. FW:    3182  gm           3 lb     32  %
Gestational Age

LMP:           26w 6d        Date:  05/22/14                 EDD:   02/26/15
Clinical EDD:  30w 2d                                        EDD:   02/02/15
U/S Today:     30w 1d                                        EDD:   02/03/15
Best:          26w 6d     Det. By:  LMP  (05/22/14)          EDD:   02/26/15
Anatomy

Cranium:          Appears normal         Aortic Arch:      Appears normal
Fetal Cavum:      Appears normal         Ductal Arch:      Appears normal
Ventricles:       Appears normal         Diaphragm:        Appears normal
Choroid Plexus:   Appears normal         Stomach:          Appears normal, left
sided
Cerebellum:       Appears normal         Abdomen:          Appears normal
Posterior Fossa:  Appears normal         Abdominal Wall:   Appears nml (cord
insert, abd wall)
Nuchal Fold:      Not applicable (>20    Cord Vessels:     Appears normal (3
wks GA)                                  vessel cord)
Face:             Orbits appear          Kidneys:          Appear normal
normal
Lips:             Not well visualized    Bladder:          Appears normal
Palate:           Not well visualized    Spine:            Appears normal
Heart:            Appears normal         Lower             Appears normal
(4CH, axis, and        Extremities:
situs)
RVOT:             Appears normal         Upper             Appears normal
Extremities:
LVOT:             Appears normal

Other:  Fetus appears to be a female. Nasal bone visualized. Heels
visualized. Right 5th digit visualized.   Technicallly difficult due to
advanced GA and maternal habitus.
Targeted Anatomy

Fetal Central Nervous System
Lat. Ventricles:  2.9                    Cisterna Magna:
Cervix Uterus Adnexa

Cervical Length:    4.85     cm

Cervix:       Normal appearance by transabdominal scan.
Uterus:       No abnormality visualized.
Cul De Sac:   No free fluid seen.
Left Ovary:    No adnexal mass visualized.
Right Ovary:   No adnexal mass visualized.
Adnexa:     No abnormality visualized.
Impression

SIUP at 71w1d (remote read)
EFW 32nd%'le
No dysmorphic features, limitations as above
No previa
Recommendations

Recommend attempt to finish fetal survey and assess interval
growth in 6 weeks (ie, please call to schedule if desired in our
unit).

questions or concerns.

## 2014-11-27 DIAGNOSIS — O99210 Obesity complicating pregnancy, unspecified trimester: Secondary | ICD-10-CM | POA: Insufficient documentation

## 2014-11-27 DIAGNOSIS — Z3A30 30 weeks gestation of pregnancy: Secondary | ICD-10-CM | POA: Insufficient documentation

## 2014-11-27 DIAGNOSIS — Z3689 Encounter for other specified antenatal screening: Secondary | ICD-10-CM | POA: Insufficient documentation

## 2014-12-04 ENCOUNTER — Other Ambulatory Visit (HOSPITAL_COMMUNITY): Payer: Self-pay | Admitting: Urology

## 2014-12-04 DIAGNOSIS — O99213 Obesity complicating pregnancy, third trimester: Secondary | ICD-10-CM

## 2014-12-04 DIAGNOSIS — Z09 Encounter for follow-up examination after completed treatment for conditions other than malignant neoplasm: Secondary | ICD-10-CM

## 2014-12-27 ENCOUNTER — Encounter: Payer: BC Managed Care – PPO | Admitting: *Deleted

## 2015-01-07 ENCOUNTER — Other Ambulatory Visit (HOSPITAL_COMMUNITY): Payer: Self-pay | Admitting: Urology

## 2015-01-07 ENCOUNTER — Other Ambulatory Visit (HOSPITAL_COMMUNITY): Payer: Self-pay | Admitting: Obstetrics and Gynecology

## 2015-01-07 ENCOUNTER — Ambulatory Visit (HOSPITAL_COMMUNITY)
Admission: RE | Admit: 2015-01-07 | Discharge: 2015-01-07 | Disposition: A | Discharge status: Home / Self Care | Payer: Medicaid Other | Source: Ambulatory Visit | Attending: Physician Assistant | Admitting: Physician Assistant

## 2015-01-07 DIAGNOSIS — O0930 Supervision of pregnancy with insufficient antenatal care, unspecified trimester: Secondary | ICD-10-CM | POA: Insufficient documentation

## 2015-01-07 DIAGNOSIS — O99213 Obesity complicating pregnancy, third trimester: Principal | ICD-10-CM

## 2015-01-07 DIAGNOSIS — Z98891 History of uterine scar from previous surgery: Secondary | ICD-10-CM | POA: Insufficient documentation

## 2015-01-07 DIAGNOSIS — Z09 Encounter for follow-up examination after completed treatment for conditions other than malignant neoplasm: Secondary | ICD-10-CM

## 2015-01-07 DIAGNOSIS — Z364 Encounter for antenatal screening for fetal growth retardation: Secondary | ICD-10-CM | POA: Insufficient documentation

## 2015-01-07 DIAGNOSIS — O409XX0 Polyhydramnios, unspecified trimester, not applicable or unspecified: Secondary | ICD-10-CM | POA: Insufficient documentation

## 2015-01-07 DIAGNOSIS — Z3A36 36 weeks gestation of pregnancy: Secondary | ICD-10-CM | POA: Insufficient documentation

## 2015-01-07 IMAGING — US US UA CORD DOPPLER
2 series · 15 of 28 positions shown · non-contrast
Comparison: none

OBSTETRICS REPORT
(Signed Final 01/07/2015 [DATE])

Date:
Faculty Physician
Service(s) Provided
US OB FOLLOW UP                                        76816.1
US UA CORD DOPPLER                                     76820.0
Indications
Evaluate anatomy not seen on prior sonogram            Z36
32 weeks gestation of pregnancy
Maternal morbid obesity (BMI 54.03)
Previous cesarean delivery
Marijuana Abuse (+ UDS 10/22/14)
Cigarette smoker (Patient claims that she is not
smoking currently)
No or Little Prenatal Care (Late prenatal care)
Maternal care for known of suspected poor fetal
growth, third trimester, not applicable or
unspecified
Polyhydramnios, third trimester, antepartum
condition or complication, unspecified fetus
Fetal Evaluation
Num Of             1
Fetuses:
Fetal Heart        144                          bpm
Rate:
Cardiac Activity:  Observed
Presentation:      Cephalic
Placenta:          Posterior, above cervical
os
P. Cord            Previously Visualized
Insertion:
Amniotic Fluid
AFI FV:      Subjectively increased
AFI Sum:     26.9     cm      96  %Tile     Larg Pckt:    8.03   cm
RUQ:   6.86    cm    RLQ:   8.03    cm   LUQ:    5.47    cm   LLQ:    6.54   cm
Biophysical Evaluation
Amniotic F.V:   Polyhydramnios              F. Tone:        Observed
F. Movement:    Observed                    Score:          [DATE]
F. Breathing:   Observed
Biometry
BPD:     88.9   m    G. Age:   36w 0d                 CI:         80.6   70 - 86
m
OFD:    110.3   m                                     FL/HC:      18.4   20.1 -
HC:     325.5   m    G. Age:   36w 6d        35  %    HC/AC:      1.07   0.93 -
AC:     305.4   m    G. Age:   34w 4d        15  %    FL/BPD      67.5   71 - 87
m                                     :
FL:        60   m    G. Age:   31w 1d       < 3  %    FL/AC:      19.6   20 - 24
HUM:     50.2   m    G. Age:   29w 3d       < 5  %
CER:     49.7   m    G. Age:   N/A         > 95  %
Est.        4859   gm    5 lb 2 oz      15   %
FW:
Gestational Age
LMP:           32w 6d        Date:  05/22/14                  EDD:   02/26/15
Clinical EDD:  36w 2d                                         EDD:   02/02/15
U/S Today:     34w 4d                                         EDD:   02/14/15
Best:          36w 2d    Det. By:   Early Ultrasound          EDD:   02/02/15
(11/06/14)
Anatomy
Cranium:          Appears normal         Aortic Arch:       Previously seen
Fetal Cavum:      Previously seen        Ductal Arch:       Previously seen
Ventricles:       Appears normal         Diaphragm:         Appears normal
Choroid Plexus:   Previously seen        Stomach:           Appears normal,
left sided
Cerebellum:       Appears normal         Abdomen:           Previously seen
Posterior         Previously seen        Abdominal          Previously seen
Fossa:                                   Wall:
Nuchal Fold:      Not applicable (>20    Cord Vessels:      Previously seen
wks GA)
Face:             Appears normal         Kidneys:           Appear normal
(orbits and profile)
Lips:             Appears normal         Bladder:           Appears normal
Heart:            Appears normal         Spine:             Previously seen
(4CH, axis, and
situs)
RVOT:             Appears normal         Lower              Previously seen
Extremities:
LVOT:             Appears normal         Upper              Previously seen
Other:   Fetus appears to be a female. Nasal bone visualized previously.
Heels visualized previously.  Right 5th digit visualized previously.
Left 5th digit visualized.  Technicallly difficult due to advanced GA
and maternal habitus.
Targeted Anatomy
Fetal Central Nervous System
Lat. Ventricles:
Doppler - Fetal Vessels
Umbilical Artery
S/D:   3.4            93   %tile       RI:
PSV:      45.7     cm/
s
Absent DFV:     No    Reverse         No
DFV:
Cervix Uterus Adnexa
Cervical Length:    4.46      cm
Cervix:       Normal appearance by transabdominal scan.
Uterus:       No abnormality visualized.
Cul De Sac:   No free fluid seen.
Left Ovary:    Size(cm) L: 2.55 x W: 1.83 x H: 1.05  Volume(cc):
2.6 Within normal limits.
Right Ovary:   Size(cm) L: 2.82 x W: 1.8 x H: 1.4  Volume(cc):
3.7 Within normal limits.
Adnexa:     No adnexal mass visualized.
Impression
INDICATION: 24 yr old [id number] at 55w2d with late prenatal
care for fetal growth and complete anatomy.

[Series 1: us ob follow up · 12 of 82 slices shown (1 of 2)]
[im 1/82]
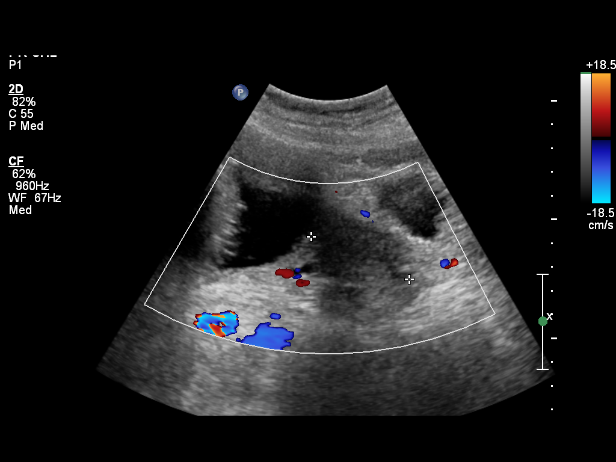
[im 8/82]
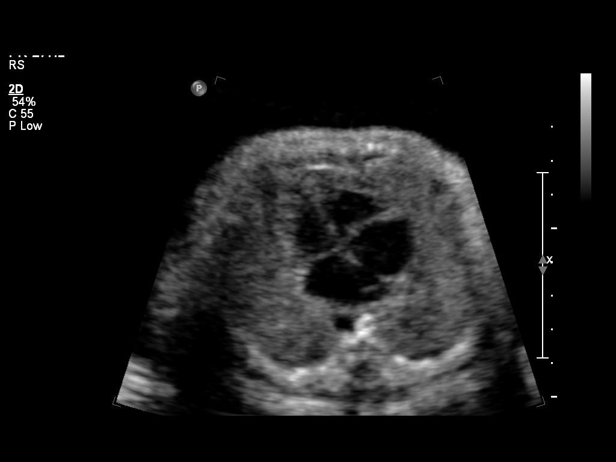
[im 15/82]
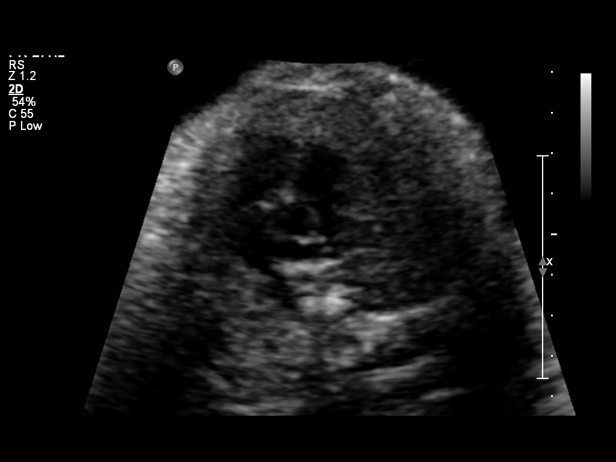
[im 23/82]
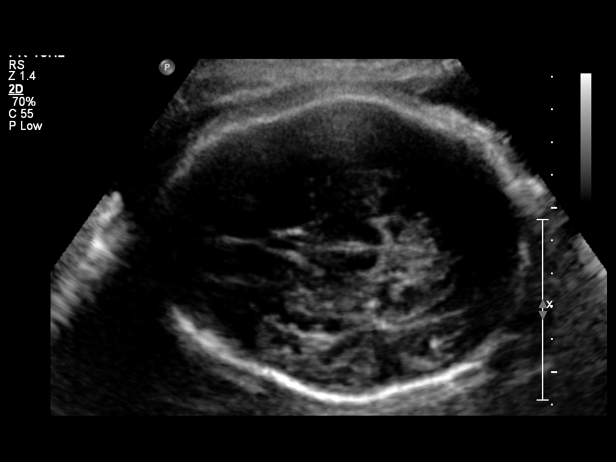
[im 30/82]
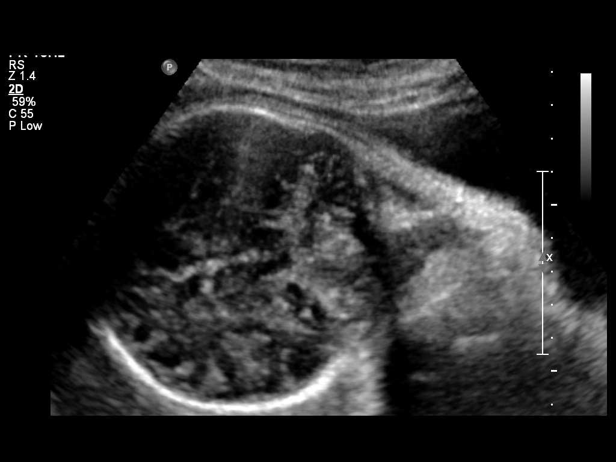
[im 37/82]
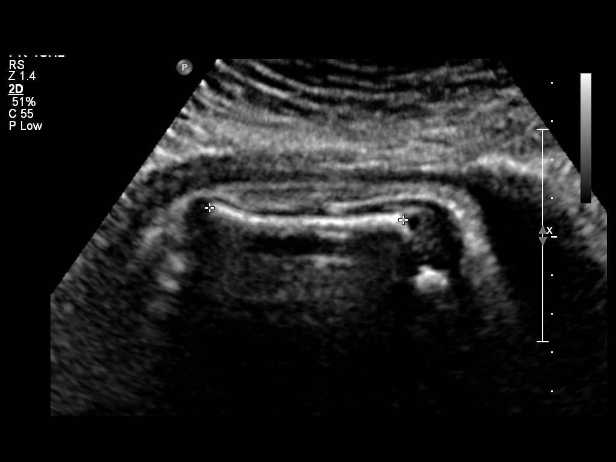
[im 45/82]
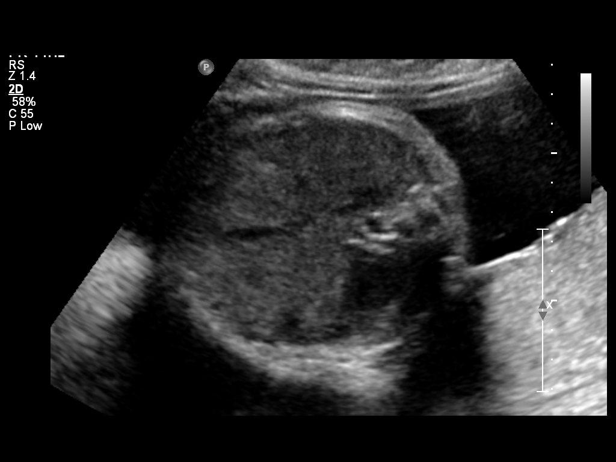
[im 52/82]
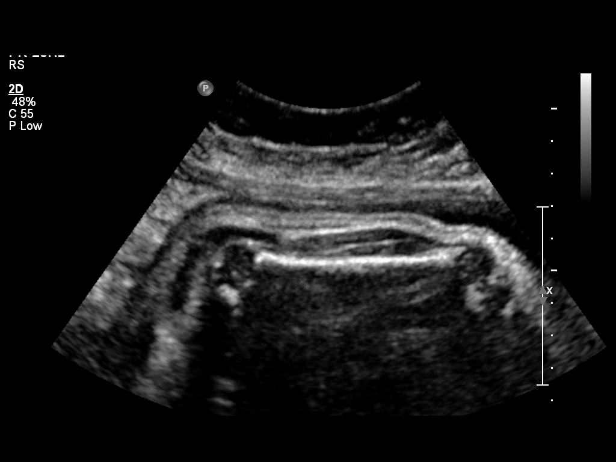
[im 56/82]
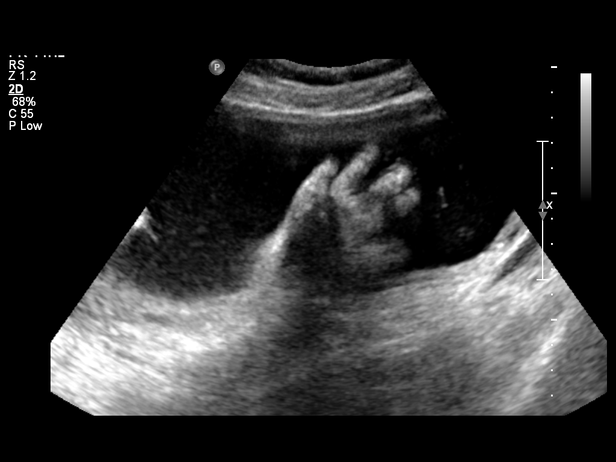
[im 63/82]
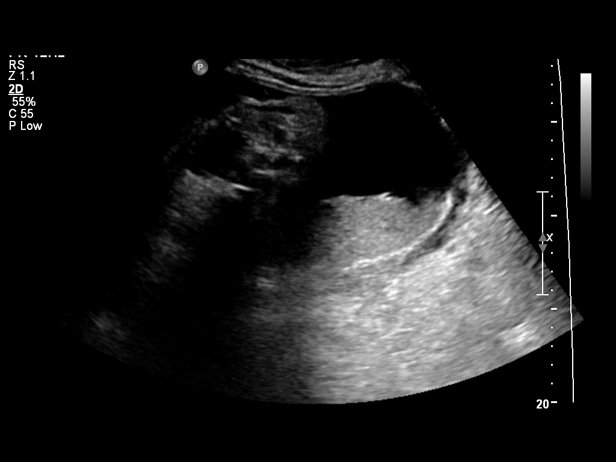
[im 70/82]
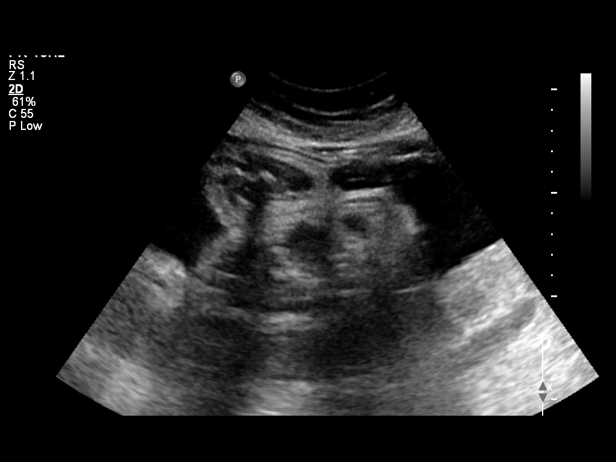
[im 78/82]
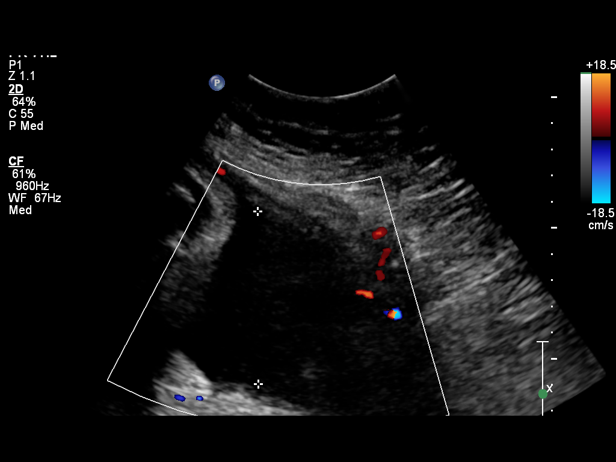

[Series 1: us ob follow up · 19 acquisitions, 3 frames shown (2 of 2)]
[im 1/19]
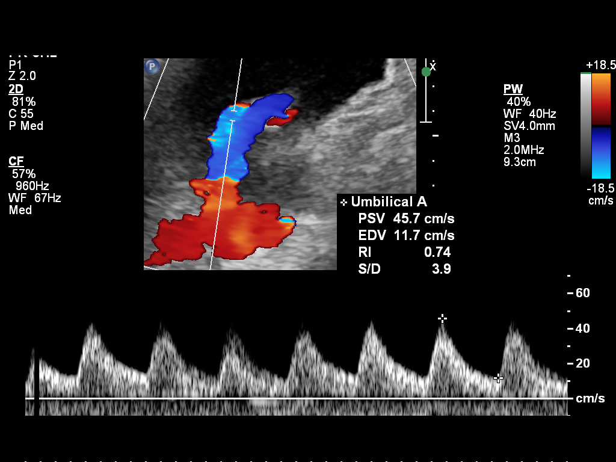
[im 10/19]
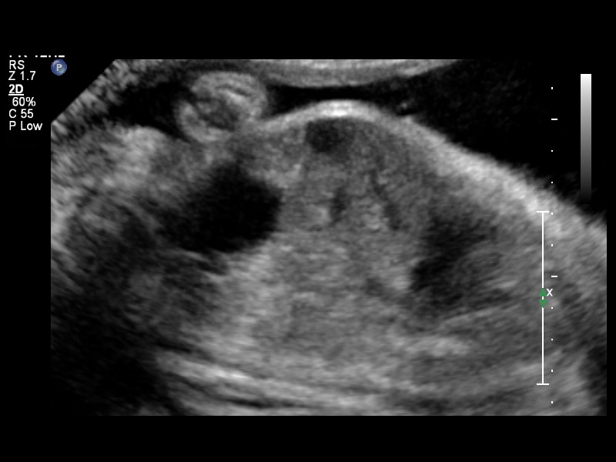
[im 19/19]
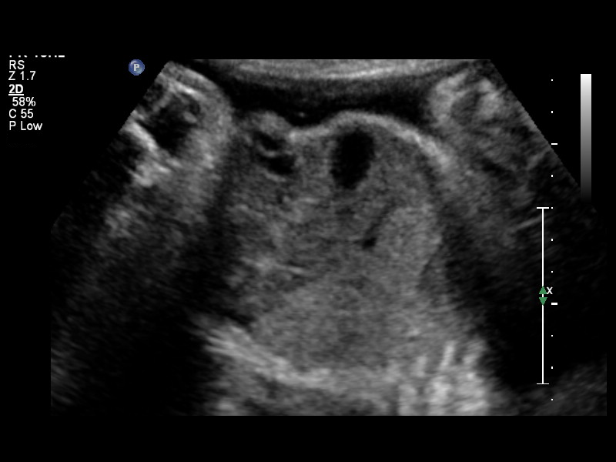

[15 of 28 positions shown; findings below may reference images not displayed]

FINDINGS: 1. Single intrauterine pregnancy.
2. Estimated fetal weight is in the 15th%.
3. Posterior placenta without evidence of previa.
4. Polyhydramnios with AFI of 26.9cm.
5. The limited anatomy survey is normal.
6. Normal biophysical profile of [DATE].
7. Normal umbilical artery Doppler studies.
Recommendations

1. Overall appropriate fetal growth:
- dated by 27 week ultrasound and given some parameters
were lagging Doppler studies were done which are normal
- recommend fetal Roumaou Yerbanga
2. Normal limited anatomy survey
3. Polyhyhdramnios:
- counseled on increased risks of preterm delivery,
PPROM, stillbirth, postpartum hemorrhage
- unclear etiology
- patient reports normal glucola
- recommend weekly biophysical profiles
- recommend delivery by EDD
4. Per patient her physician plans on delivering via repeat C
section at 39 weeks

## 2015-01-08 ENCOUNTER — Other Ambulatory Visit: Payer: Self-pay | Admitting: Obstetrics & Gynecology

## 2015-01-12 ENCOUNTER — Other Ambulatory Visit: Payer: Self-pay | Admitting: Obstetrics & Gynecology

## 2015-01-16 ENCOUNTER — Ambulatory Visit (HOSPITAL_COMMUNITY)
Admission: RE | Admit: 2015-01-16 | Discharge: 2015-01-16 | Disposition: A | Discharge status: Home / Self Care | Payer: Medicaid Other | Source: Ambulatory Visit | Attending: Physician Assistant | Admitting: Physician Assistant

## 2015-01-16 ENCOUNTER — Encounter (HOSPITAL_COMMUNITY): Payer: Self-pay

## 2015-01-16 DIAGNOSIS — O99213 Obesity complicating pregnancy, third trimester: Secondary | ICD-10-CM | POA: Diagnosis present

## 2015-01-16 DIAGNOSIS — F1910 Other psychoactive substance abuse, uncomplicated: Secondary | ICD-10-CM | POA: Insufficient documentation

## 2015-01-16 DIAGNOSIS — Z3A00 Weeks of gestation of pregnancy not specified: Secondary | ICD-10-CM | POA: Diagnosis not present

## 2015-01-16 DIAGNOSIS — Z3A37 37 weeks gestation of pregnancy: Secondary | ICD-10-CM | POA: Insufficient documentation

## 2015-01-17 ENCOUNTER — Other Ambulatory Visit (HOSPITAL_COMMUNITY): Payer: Self-pay | Admitting: Physician Assistant

## 2015-01-23 ENCOUNTER — Encounter (HOSPITAL_COMMUNITY)
Admission: RE | Admit: 2015-01-23 | Discharge: 2015-01-23 | Disposition: A | Discharge status: Home / Self Care | Payer: Medicaid Other | Source: Ambulatory Visit | Attending: Obstetrics & Gynecology | Admitting: Obstetrics & Gynecology

## 2015-01-23 ENCOUNTER — Ambulatory Visit (HOSPITAL_COMMUNITY): Payer: Medicaid Other

## 2015-01-23 ENCOUNTER — Encounter (HOSPITAL_COMMUNITY): Payer: Self-pay

## 2015-01-23 ENCOUNTER — Ambulatory Visit (HOSPITAL_COMMUNITY)
Admission: RE | Admit: 2015-01-23 | Discharge: 2015-01-23 | Disposition: A | Discharge status: Home / Self Care | Payer: Medicaid Other | Source: Ambulatory Visit | Attending: Physician Assistant | Admitting: Physician Assistant

## 2015-01-23 ENCOUNTER — Other Ambulatory Visit (HOSPITAL_COMMUNITY): Payer: BC Managed Care – PPO

## 2015-01-23 DIAGNOSIS — O99213 Obesity complicating pregnancy, third trimester: Secondary | ICD-10-CM | POA: Diagnosis not present

## 2015-01-23 HISTORY — DX: Other specified health status: Z78.9

## 2015-01-23 LAB — CBC
HEMATOCRIT: 33.3 % — AB (ref 36.0–46.0)
Hemoglobin: 10.5 g/dL — ABNORMAL LOW (ref 12.0–15.0)
MCH: 27.3 pg (ref 26.0–34.0)
MCHC: 31.5 g/dL (ref 30.0–36.0)
MCV: 86.5 fL (ref 78.0–100.0)
Platelets: 349 10*3/uL (ref 150–400)
RBC: 3.85 MIL/uL — AB (ref 3.87–5.11)
RDW: 14.4 % (ref 11.5–15.5)
WBC: 19.3 10*3/uL — ABNORMAL HIGH (ref 4.0–10.5)

## 2015-01-23 LAB — TYPE AND SCREEN
ABO/RH(D): AB POS
Antibody Screen: NEGATIVE

## 2015-01-23 NOTE — Patient Instructions (Addendum)
Your procedure is scheduled on:01/26/15  Enter through the Main Entrance at :7:45 am Pick up desk phone and dial [phone] and inform us of your arrival.  Please call [phone] if you have any problems the morning of surgery.  Remember: Do not eat food or drink liquids, including water, after midnight:Friday    You may brush your teeth the morning of surgery.   DO NOT wear jewelry, eye make-up, lipstick,body lotion, or dark fingernail polish.  (Polished toes are ok) You may wear deodorant.  If you are to be admitted after surgery, leave suitcase in car until your room has been assigned.  Wear loose fitting, comfortable clothes for your ride home.

## 2015-01-23 NOTE — Pre-Procedure Instructions (Signed)
Spoke with Lorelle Formosa in OB/GYN office to give WBC result to Dr. Debroah Loop. She will notify him. WBC=19.3

## 2015-01-24 ENCOUNTER — Inpatient Hospital Stay (HOSPITAL_COMMUNITY): Admission: RE | Admit: 2015-01-24 | Payer: BC Managed Care – PPO | Source: Ambulatory Visit

## 2015-01-24 LAB — RPR: RPR Ser Ql: NONREACTIVE

## 2015-01-25 MED ORDER — DEXTROSE 5 % IV SOLN
3.0000 g | INTRAVENOUS | Status: AC
  Administered 2015-01-26: 3 g via INTRAVENOUS
  Filled 2015-01-25: qty 3000

## 2015-01-25 NOTE — Anesthesia Preprocedure Evaluation (Signed)
Anesthesia Evaluation  Patient identified by MRN, date of birth, ID band Patient awake    Reviewed: Allergy & Precautions, NPO status , Patient's Chart, lab work & pertinent test results  History of Anesthesia Complications Negative for: history of anesthetic complications  Airway Mallampati: II  TM Distance: >3 FB Neck ROM: Full    Dental no notable dental hx. (+) Dental Advisory Given   Pulmonary Current Smoker,  breath sounds clear to auscultation  Pulmonary exam normal       Cardiovascular negative cardio ROS Normal cardiovascular examRhythm:Regular Rate:Normal     Neuro/Psych negative neurological ROS  negative psych ROS   GI/Hepatic negative GI ROS, (+)     substance abuse  marijuana use,   Endo/Other  Morbid obesity  Renal/GU negative Renal ROS  negative genitourinary   Musculoskeletal negative musculoskeletal ROS (+)   Abdominal   Peds negative pediatric ROS (+)  Hematology negative hematology ROS (+)   Anesthesia Other Findings   Reproductive/Obstetrics (+) Pregnancy                             Anesthesia Physical Anesthesia Plan  ASA: III  Anesthesia Plan: Combined Spinal and Epidural   Post-op Pain Management:    Induction:   Airway Management Planned:   Additional Equipment:   Intra-op Plan:   Post-operative Plan:   Informed Consent: I have reviewed the patients History and Physical, chart, labs and discussed the procedure including the risks, benefits and alternatives for the proposed anesthesia with the patient or authorized representative who has indicated his/her understanding and acceptance.   Dental advisory given  Plan Discussed with: CRNA  Anesthesia Plan Comments:         Anesthesia Quick Evaluation

## 2015-01-26 ENCOUNTER — Inpatient Hospital Stay (HOSPITAL_COMMUNITY): Payer: Medicaid Other | Admitting: Anesthesiology

## 2015-01-26 ENCOUNTER — Encounter (HOSPITAL_COMMUNITY): Payer: Self-pay

## 2015-01-26 ENCOUNTER — Encounter (HOSPITAL_COMMUNITY)
Admission: RE | Disposition: A | Discharge status: Home / Self Care | Payer: Self-pay | Source: Ambulatory Visit | Attending: Obstetrics & Gynecology

## 2015-01-26 ENCOUNTER — Inpatient Hospital Stay (HOSPITAL_COMMUNITY)
Admission: RE | Admit: 2015-01-26 | Discharge: 2015-01-28 | DRG: 765 | Disposition: A | Discharge status: Home / Self Care | Payer: Medicaid Other | Source: Ambulatory Visit | Attending: Obstetrics and Gynecology | Admitting: Obstetrics and Gynecology

## 2015-01-26 DIAGNOSIS — F17210 Nicotine dependence, cigarettes, uncomplicated: Secondary | ICD-10-CM | POA: Diagnosis present

## 2015-01-26 DIAGNOSIS — Z6841 Body Mass Index (BMI) 40.0 and over, adult: Secondary | ICD-10-CM

## 2015-01-26 DIAGNOSIS — Z8249 Family history of ischemic heart disease and other diseases of the circulatory system: Secondary | ICD-10-CM

## 2015-01-26 DIAGNOSIS — O99334 Smoking (tobacco) complicating childbirth: Secondary | ICD-10-CM | POA: Diagnosis present

## 2015-01-26 DIAGNOSIS — O403XX0 Polyhydramnios, third trimester, not applicable or unspecified: Secondary | ICD-10-CM | POA: Diagnosis present

## 2015-01-26 DIAGNOSIS — Z3A39 39 weeks gestation of pregnancy: Secondary | ICD-10-CM | POA: Diagnosis present

## 2015-01-26 DIAGNOSIS — O99213 Obesity complicating pregnancy, third trimester: Secondary | ICD-10-CM

## 2015-01-26 DIAGNOSIS — O99324 Drug use complicating childbirth: Secondary | ICD-10-CM | POA: Diagnosis present

## 2015-01-26 DIAGNOSIS — F1290 Cannabis use, unspecified, uncomplicated: Secondary | ICD-10-CM | POA: Diagnosis present

## 2015-01-26 DIAGNOSIS — O3421 Maternal care for scar from previous cesarean delivery: Secondary | ICD-10-CM | POA: Diagnosis present

## 2015-01-26 DIAGNOSIS — O99214 Obesity complicating childbirth: Secondary | ICD-10-CM | POA: Diagnosis present

## 2015-01-26 DIAGNOSIS — Z302 Encounter for sterilization: Secondary | ICD-10-CM

## 2015-01-26 DIAGNOSIS — Z98891 History of uterine scar from previous surgery: Secondary | ICD-10-CM

## 2015-01-26 SURGERY — Surgical Case
Anesthesia: Spinal | Site: Abdomen | Laterality: Bilateral

## 2015-01-26 MED ORDER — SCOPOLAMINE 1 MG/3DAYS TD PT72
1.0000 | MEDICATED_PATCH | Freq: Once | TRANSDERMAL | Status: DC
  Administered 2015-01-26: 1.5 mg via TRANSDERMAL

## 2015-01-26 MED ORDER — LACTATED RINGERS IV SOLN
INTRAVENOUS | Status: DC
  Administered 2015-01-26: 10:00:00 via INTRAVENOUS

## 2015-01-26 MED ORDER — OXYTOCIN 10 UNIT/ML IJ SOLN
INTRAMUSCULAR | Status: AC
  Filled 2015-01-26: qty 4

## 2015-01-26 MED ORDER — PHENYLEPHRINE 8 MG IN D5W 100 ML (0.08MG/ML) PREMIX OPTIME
INJECTION | INTRAVENOUS | Status: AC
  Filled 2015-01-26: qty 100

## 2015-01-26 MED ORDER — KETOROLAC TROMETHAMINE 30 MG/ML IJ SOLN
30.0000 mg | Freq: Four times a day (QID) | INTRAMUSCULAR | Status: AC | PRN
  Administered 2015-01-26: 30 mg via INTRAVENOUS

## 2015-01-26 MED ORDER — DIPHENHYDRAMINE HCL 50 MG/ML IJ SOLN: 12.5000 mg | INTRAMUSCULAR | Status: DC | PRN

## 2015-01-26 MED ORDER — NALBUPHINE HCL 10 MG/ML IJ SOLN: 5.0000 mg | Freq: Once | INTRAMUSCULAR | Status: AC | PRN

## 2015-01-26 MED ORDER — OXYCODONE-ACETAMINOPHEN 5-325 MG PO TABS
2.0000 | ORAL_TABLET | ORAL | Status: DC | PRN
  Administered 2015-01-27 – 2015-01-28 (×2): 2 via ORAL
  Filled 2015-01-26 (×3): qty 2

## 2015-01-26 MED ORDER — MEPERIDINE HCL 25 MG/ML IJ SOLN: 6.2500 mg | INTRAMUSCULAR | Status: DC | PRN

## 2015-01-26 MED ORDER — PRENATAL MULTIVITAMIN CH
1.0000 | ORAL_TABLET | Freq: Every day | ORAL | Status: DC
  Administered 2015-01-27: 1 via ORAL
  Filled 2015-01-26: qty 1

## 2015-01-26 MED ORDER — DIPHENHYDRAMINE HCL 25 MG PO CAPS: 25.0000 mg | ORAL_CAPSULE | ORAL | Status: DC | PRN

## 2015-01-26 MED ORDER — PHENYLEPHRINE 8 MG IN D5W 100 ML (0.08MG/ML) PREMIX OPTIME
INJECTION | INTRAVENOUS | Status: DC | PRN
  Administered 2015-01-26: 60 ug/min via INTRAVENOUS

## 2015-01-26 MED ORDER — MORPHINE SULFATE 0.5 MG/ML IJ SOLN
INTRAMUSCULAR | Status: AC
  Filled 2015-01-26: qty 100

## 2015-01-26 MED ORDER — LACTATED RINGERS IV SOLN: INTRAVENOUS | Status: DC

## 2015-01-26 MED ORDER — NALBUPHINE HCL 10 MG/ML IJ SOLN: 5.0000 mg | INTRAMUSCULAR | Status: DC | PRN

## 2015-01-26 MED ORDER — OXYTOCIN 40 UNITS IN LACTATED RINGERS INFUSION - SIMPLE MED: 62.5000 mL/h | INTRAVENOUS | Status: AC

## 2015-01-26 MED ORDER — KETOROLAC TROMETHAMINE 30 MG/ML IJ SOLN: 30.0000 mg | Freq: Four times a day (QID) | INTRAMUSCULAR | Status: AC | PRN

## 2015-01-26 MED ORDER — BUPIVACAINE IN DEXTROSE 0.75-8.25 % IT SOLN
INTRATHECAL | Status: DC | PRN
  Administered 2015-01-26: 1.6 mL via INTRATHECAL

## 2015-01-26 MED ORDER — KETOROLAC TROMETHAMINE 30 MG/ML IJ SOLN
INTRAMUSCULAR | Status: AC
  Administered 2015-01-26: 30 mg via INTRAVENOUS
  Filled 2015-01-26: qty 1

## 2015-01-26 MED ORDER — NALOXONE HCL 0.4 MG/ML IJ SOLN: 0.4000 mg | INTRAMUSCULAR | Status: DC | PRN

## 2015-01-26 MED ORDER — BUPIVACAINE HCL (PF) 0.5 % IJ SOLN
INTRAMUSCULAR | Status: AC
  Filled 2015-01-26: qty 30

## 2015-01-26 MED ORDER — DIBUCAINE 1 % RE OINT: 1.0000 "application " | TOPICAL_OINTMENT | RECTAL | Status: DC | PRN

## 2015-01-26 MED ORDER — TETANUS-DIPHTH-ACELL PERTUSSIS 5-2.5-18.5 LF-MCG/0.5 IM SUSP: 0.5000 mL | Freq: Once | INTRAMUSCULAR | Status: DC

## 2015-01-26 MED ORDER — SIMETHICONE 80 MG PO CHEW
80.0000 mg | CHEWABLE_TABLET | ORAL | Status: DC
  Administered 2015-01-26 – 2015-01-27 (×2): 80 mg via ORAL
  Filled 2015-01-26 (×2): qty 1

## 2015-01-26 MED ORDER — NALOXONE HCL 1 MG/ML IJ SOLN
1.0000 ug/kg/h | INTRAVENOUS | Status: DC | PRN
  Filled 2015-01-26: qty 2

## 2015-01-26 MED ORDER — ONDANSETRON HCL 4 MG/2ML IJ SOLN: 4.0000 mg | Freq: Three times a day (TID) | INTRAMUSCULAR | Status: DC | PRN

## 2015-01-26 MED ORDER — BUPIVACAINE IN DEXTROSE 0.75-8.25 % IT SOLN
INTRATHECAL | Status: AC
Start: 2015-01-26 — End: 2015-01-26
  Filled 2015-01-26: qty 2

## 2015-01-26 MED ORDER — ONDANSETRON HCL 4 MG/2ML IJ SOLN
4.0000 mg | Freq: Once | INTRAMUSCULAR | Status: DC | PRN
Start: 2015-01-26 — End: 2015-01-26

## 2015-01-26 MED ORDER — LANOLIN HYDROUS EX OINT: 1.0000 "application " | TOPICAL_OINTMENT | CUTANEOUS | Status: DC | PRN

## 2015-01-26 MED ORDER — FENTANYL CITRATE (PF) 100 MCG/2ML IJ SOLN
INTRAMUSCULAR | Status: AC
  Filled 2015-01-26: qty 4

## 2015-01-26 MED ORDER — SCOPOLAMINE 1 MG/3DAYS TD PT72
1.0000 | MEDICATED_PATCH | Freq: Once | TRANSDERMAL | Status: DC
  Filled 2015-01-26: qty 1

## 2015-01-26 MED ORDER — SIMETHICONE 80 MG PO CHEW
80.0000 mg | CHEWABLE_TABLET | Freq: Three times a day (TID) | ORAL | Status: DC
  Administered 2015-01-27 – 2015-01-28 (×3): 80 mg via ORAL
  Filled 2015-01-26 (×3): qty 1

## 2015-01-26 MED ORDER — OXYTOCIN 10 UNIT/ML IJ SOLN
40.0000 [IU] | INTRAMUSCULAR | Status: DC | PRN
  Administered 2015-01-26: 40 [IU] via INTRAVENOUS

## 2015-01-26 MED ORDER — MORPHINE SULFATE (PF) 0.5 MG/ML IJ SOLN
INTRAMUSCULAR | Status: DC | PRN
  Administered 2015-01-26: .2 mg via EPIDURAL

## 2015-01-26 MED ORDER — BUPIVACAINE HCL (PF) 0.5 % IJ SOLN
INTRAMUSCULAR | Status: DC | PRN
  Administered 2015-01-26: 29 mL
  Administered 2015-01-26: 1 mL

## 2015-01-26 MED ORDER — SIMETHICONE 80 MG PO CHEW
80.0000 mg | CHEWABLE_TABLET | ORAL | Status: DC | PRN
  Administered 2015-01-27: 80 mg via ORAL
  Filled 2015-01-26: qty 1

## 2015-01-26 MED ORDER — IBUPROFEN 600 MG PO TABS
600.0000 mg | ORAL_TABLET | Freq: Four times a day (QID) | ORAL | Status: DC
  Administered 2015-01-26 – 2015-01-28 (×7): 600 mg via ORAL
  Filled 2015-01-26 (×7): qty 1

## 2015-01-26 MED ORDER — SENNOSIDES-DOCUSATE SODIUM 8.6-50 MG PO TABS
2.0000 | ORAL_TABLET | ORAL | Status: DC
  Administered 2015-01-26 – 2015-01-27 (×2): 2 via ORAL
  Filled 2015-01-26 (×2): qty 2

## 2015-01-26 MED ORDER — OXYCODONE-ACETAMINOPHEN 5-325 MG PO TABS
1.0000 | ORAL_TABLET | ORAL | Status: DC | PRN
  Administered 2015-01-27 – 2015-01-28 (×2): 1 via ORAL
  Filled 2015-01-26 (×2): qty 1

## 2015-01-26 MED ORDER — NALBUPHINE HCL 10 MG/ML IJ SOLN
5.0000 mg | INTRAMUSCULAR | Status: DC | PRN
  Administered 2015-01-26 (×2): 5 mg via INTRAVENOUS
  Filled 2015-01-26: qty 1

## 2015-01-26 MED ORDER — FENTANYL CITRATE (PF) 100 MCG/2ML IJ SOLN: 25.0000 ug | INTRAMUSCULAR | Status: DC | PRN

## 2015-01-26 MED ORDER — SCOPOLAMINE 1 MG/3DAYS TD PT72
MEDICATED_PATCH | TRANSDERMAL | Status: AC
  Filled 2015-01-26: qty 1

## 2015-01-26 MED ORDER — LACTATED RINGERS IV SOLN
Freq: Once | INTRAVENOUS | Status: AC
  Administered 2015-01-26: 10:00:00 via INTRAVENOUS
  Administered 2015-01-26: 1000 mL/h via INTRAVENOUS
  Administered 2015-01-26: 10:00:00 via INTRAVENOUS

## 2015-01-26 MED ORDER — DIPHENHYDRAMINE HCL 25 MG PO CAPS: 25.0000 mg | ORAL_CAPSULE | Freq: Four times a day (QID) | ORAL | Status: DC | PRN

## 2015-01-26 MED ORDER — WITCH HAZEL-GLYCERIN EX PADS: 1.0000 "application " | MEDICATED_PAD | CUTANEOUS | Status: DC | PRN

## 2015-01-26 MED ORDER — ZOLPIDEM TARTRATE 5 MG PO TABS: 5.0000 mg | ORAL_TABLET | Freq: Every evening | ORAL | Status: DC | PRN

## 2015-01-26 MED ORDER — FENTANYL CITRATE (PF) 100 MCG/2ML IJ SOLN
INTRAMUSCULAR | Status: DC | PRN
  Administered 2015-01-26: 10 ug via INTRATHECAL
  Administered 2015-01-26: 50 ug via INTRAVENOUS
  Administered 2015-01-26: 40 ug via INTRAVENOUS

## 2015-01-26 MED ORDER — ACETAMINOPHEN 325 MG PO TABS: 650.0000 mg | ORAL_TABLET | ORAL | Status: DC | PRN

## 2015-01-26 MED ORDER — ONDANSETRON HCL 4 MG/2ML IJ SOLN
INTRAMUSCULAR | Status: DC | PRN
  Administered 2015-01-26: 4 mg via INTRAVENOUS

## 2015-01-26 MED ORDER — MENTHOL 3 MG MT LOZG
1.0000 | LOZENGE | OROMUCOSAL | Status: DC | PRN
Start: 2015-01-26 — End: 2015-01-28

## 2015-01-26 MED ORDER — NALBUPHINE HCL 10 MG/ML IJ SOLN
5.0000 mg | Freq: Once | INTRAMUSCULAR | Status: AC | PRN
  Filled 2015-01-26: qty 1

## 2015-01-26 MED ORDER — ONDANSETRON HCL 4 MG/2ML IJ SOLN
INTRAMUSCULAR | Status: AC
  Filled 2015-01-26: qty 2

## 2015-01-26 MED ORDER — SODIUM CHLORIDE 0.9 % IJ SOLN: 3.0000 mL | INTRAMUSCULAR | Status: DC | PRN

## 2015-01-26 SURGICAL SUPPLY — 31 items
BARRIER ADHS 3X4 INTERCEED (GAUZE/BANDAGES/DRESSINGS) IMPLANT
CLAMP CORD UMBIL (MISCELLANEOUS) IMPLANT
CLIP FILSHIE TUBAL LIGA STRL (Clip) ×3 IMPLANT
CLOTH BEACON ORANGE TIMEOUT ST (SAFETY) ×3 IMPLANT
DRAPE SHEET LG 3/4 BI-LAMINATE (DRAPES) IMPLANT
DRSG OPSITE POSTOP 4X10 (GAUZE/BANDAGES/DRESSINGS) ×3 IMPLANT
DURAPREP 26ML APPLICATOR (WOUND CARE) ×3 IMPLANT
ELECT REM PT RETURN 9FT ADLT (ELECTROSURGICAL) ×3
ELECTRODE REM PT RTRN 9FT ADLT (ELECTROSURGICAL) ×1 IMPLANT
EXTRACTOR VACUUM KIWI (MISCELLANEOUS) ×3 IMPLANT
GLOVE BIO SURGEON STRL SZ 6.5 (GLOVE) ×2 IMPLANT
GLOVE BIO SURGEONS STRL SZ 6.5 (GLOVE) ×1
GLOVE BIOGEL PI IND STRL 7.0 (GLOVE) ×1 IMPLANT
GLOVE BIOGEL PI INDICATOR 7.0 (GLOVE) ×2
GOWN STRL REUS W/TWL LRG LVL3 (GOWN DISPOSABLE) ×6 IMPLANT
KIT ABG SYR 3ML LUER SLIP (SYRINGE) IMPLANT
NEEDLE HYPO 22GX1.5 SAFETY (NEEDLE) IMPLANT
NEEDLE HYPO 25X5/8 SAFETYGLIDE (NEEDLE) IMPLANT
NS IRRIG 1000ML POUR BTL (IV SOLUTION) ×3 IMPLANT
PACK C SECTION WH (CUSTOM PROCEDURE TRAY) ×3 IMPLANT
PAD OB MATERNITY 4.3X12.25 (PERSONAL CARE ITEMS) ×3 IMPLANT
RETRACTOR TRAXI PANNICULUS (MISCELLANEOUS) ×1 IMPLANT
RTRCTR C-SECT PINK 25CM LRG (MISCELLANEOUS) ×3 IMPLANT
SUT VIC AB 0 CT1 36 (SUTURE) ×12 IMPLANT
SUT VIC AB 2-0 CT1 27 (SUTURE) ×2
SUT VIC AB 2-0 CT1 TAPERPNT 27 (SUTURE) ×1 IMPLANT
SUT VIC AB 4-0 PS2 27 (SUTURE) ×3 IMPLANT
SYR CONTROL 10ML LL (SYRINGE) IMPLANT
TOWEL OR 17X24 6PK STRL BLUE (TOWEL DISPOSABLE) ×3 IMPLANT
TRAXI PANNICULUS RETRACTOR (MISCELLANEOUS) ×2
TRAY FOLEY CATH SILVER 14FR (SET/KITS/TRAYS/PACK) IMPLANT

## 2015-01-26 NOTE — Addendum Note (Signed)
Addendum  created 01/26/15 1836 by Angela Adam, CRNA   Modules edited: Charges VN

## 2015-01-26 NOTE — Anesthesia Postprocedure Evaluation (Signed)
  Anesthesia Post-op Note  Patient: Felicia Hicks  Procedure(s) Performed: Procedure(s) (LRB): CESAREAN SECTION WITH BILATERAL TUBAL LIGATION (Bilateral)  Patient Location: PACU  Anesthesia Type: Epidural  Level of Consciousness: awake and alert   Airway and Oxygen Therapy: Patient Spontanous Breathing  Post-op Pain: mild  Post-op Assessment: Post-op Vital signs reviewed, Patient's Cardiovascular Status Stable, Respiratory Function Stable, Patent Airway and No signs of Nausea or vomiting  Last Vitals:  Filed Vitals:   01/26/15 1215  BP:   Pulse: 72  Temp:   Resp: 16    Post-op Vital Signs: stable   Complications: No apparent anesthesia complications

## 2015-01-26 NOTE — H&P (Signed)
Felicia Hicks is a 24 y.o. female presenting for repeat cesarean section and bilateral tubal ligation [redacted]w[redacted]d G2P1001The procedure and the risk of anesthesia, bleeding, infection, bowel and bladder injury, failure (1/200) and ectopic pregnancy were discussed and her questions were answered.  Maternal Medical History:  Reason for admission: Repeat cesarean section and bilateral tubal ligation  Fetal activity: Perceived fetal activity is normal.      OB History    Gravida Para Term Preterm AB TAB SAB Ectopic Multiple Living   Past Medical History  Diagnosis Date  . No pertinent past medical history   . S/P cesarean section 06/11/2012    x1  . Medical history non-contributory    Past Surgical History  Procedure Laterality Date  . Mouth surgery    . Cesarean section  06/09/2012    Procedure: CESAREAN SECTION;  Surgeon: Bing Plume, MD;  Location: [facility];  Service: Obstetrics;  Laterality: N/A;  Primary Cesarean Section Delivery Baby Boy @ [phone], Apgars 8/9   Family History: family history includes Hypertension in her other. Social History:  reports that she has been smoking Cigarettes.  She has been smoking about 0.25 packs per day. She does not have any smokeless tobacco history on file. She reports that she uses illicit drugs (Marijuana) about 7 times per week. She reports that she does not drink alcohol.   Prenatal Transfer Tool  Maternal Diabetes: No Genetic Screening: Declined Maternal Ultrasounds/Referrals: Normal Fetal Ultrasounds or other Referrals:  None Maternal Substance Abuse:  Yes:  Type: Marijuana screen pos Significant Maternal Medications:  None Significant Maternal Lab Results:  None Other Comments:  None  Review of Systems  Constitutional: Negative.   Respiratory: Negative.   Gastrointestinal: Negative.   Genitourinary: Negative.       Blood pressure 121/81, temperature 97.9 F (36.6 C), temperature source Oral, resp. rate 18, last  menstrual period 05/22/2014, SpO2 100 %, unknown if currently breastfeeding. Maternal Exam:  Abdomen: Patient reports no abdominal tenderness. Surgical scars: low transverse.   Introitus: not evaluated.   Cervix: not evaluated.   Physical Exam  Vitals reviewed. Constitutional: She is oriented to person, place, and time. She appears well-developed. No distress.  Neck: Normal range of motion.  Cardiovascular: Normal rate.   Respiratory: Effort normal. No respiratory distress.  GI: Soft.  Musculoskeletal: Normal range of motion.  Neurological: She is alert and oriented to person, place, and time.  Skin: Skin is warm.  Psychiatric: She has a normal mood and affect. Her behavior is normal.    Prenatal labs: ABO, Rh: --/--/AB POS (08/03 0450) Antibody: NEG (08/03 0450) Rubella:   RPR: Non Reactive (08/03 0450)  HBsAg:    HIV:    GBS:     Assessment/Plan: Posted for repeat CS and BTL, declined TOLAC   ARNOLD,Pina 01/26/2015, 9:11 AM

## 2015-01-26 NOTE — Op Note (Signed)
Cesarean Section Operative Report  Nekayla Heider  01/26/2015  Indications: Scheduled Proceedure/Maternal Request  Pre-operative Diagnosis: cpt 59514 - REPEAT c/section. With bilateral tubal ligation  Post-operative Diagnosis: Same   Surgeon: Surgeon(s) and Role:    * Adam Phenix, MD - Primary    * Kathrynn Running, MD - Assisting   Attending Attestation: I was present and scrubbed for the entire procedure.   Assistants: see above  Anesthesia: spinal    Estimated Blood Loss: * No blood loss amount entered *   Total IV Fluids: 2800 ml LR   Urine Output: 290 CC OF clear urine  Specimens: none   Baby condition / location:  Couplet care / Skin to Skin 2880 gm APGAR: 7/9 ,     Complications: no complications  Indications: Arretta Toenjes is a 24 y.o. [id number] with an IUP [redacted]w[redacted]d presenting for repeat c/s. Pregnancy c/b morbid obesity, polyhydramnios, and substance abuse (marijuana).  The risks, benefits, complications, treatment options, and expected outcomes were discussed with the patient . The patient concurred with the proposed plan, giving informed consent. identified as Margaretha Seeds and the procedure verified as C-Section Delivery.  Procedure Details: A Time Out was held and the above information confirmed.  The patient was taken back to the operative suite where spinal anesthesia was placed.  After induction of anesthesia, the patient was draped and prepped in the usual sterile manner and placed in a dorsal supine position with a leftward tilt. A transverse was made and carried down through the subcutaneous tissue to the fascia. Fascial incision was made and extended transversely. The fascia was separated from the underlying rectus tissue superiorly and inferiorly. The peritoneum was identified and entered. Peritoneal incision was extended longitudinally. The utero-vesical peritoneal reflection was incised transversely and the bladder flap was bluntly freed from the lower  uterine segment. A low transverse uterine incision was made, parted bluntly. After initial difficulty delivering fetal head, lateral extension of hysterotomy incision made with bandage scissors. Vacuum applied x3 with 3 pop-offs. Fetus then delivered from cephalic presentation. Infant vigorous and crying at birth and so no cord ph was not sent. the umbilical cord was clamped and cut cord blood was obtained for evaluation. The placenta was removed Intact and appeared normal. The uterine outline, tubes and ovaries appeared normal}. The Fallopian tubes were identified bilaterally.  A Filshie clip was placed on each tube without difficulty 3 cm from the cornua.  There was no bleeding. The uterine incision was closed with running locked sutures of 0Vicryl and then imbricated with the same. One mattress suture and one figure of 8 were applied for hemostasis.   Hemostasis was observed.  The fascia was then reapproximated with running sutures of 0Vicryl. The subcuticular closure was performed using 2-0chromic gut. The skin was closed with 4-0Vicryl.   Instrument, sponge, and needle counts were correct prior the abdominal closure and were correct at the conclusion of the case.     Disposition: Short Stay   Maternal Condition: stable       Signed: Cherrie Gauze WoukMD 01/26/2015 1:33 PM

## 2015-01-26 NOTE — Transfer of Care (Signed)
Immediate Anesthesia Transfer of Care Note  Patient: Felicia Hicks  Procedure(s) Performed: Procedure(s) with comments: CESAREAN SECTION WITH BILATERAL TUBAL LIGATION (Bilateral) - requesting 01/26/15 @ 9:00a Sherlyn Lick Confirmed with Dr. Debroah Loop to RNFA on 01/20/2015  Patient Location: PACU  Anesthesia Type:Spinal  Level of Consciousness: awake and alert   Airway & Oxygen Therapy: Patient Spontanous Breathing  Post-op Assessment: Report given to RN and Post -op Vital signs reviewed and stable  Post vital signs: Reviewed and stable  Last Vitals:  Filed Vitals:   01/26/15 0806  BP: 121/81  Temp: 36.6 C  Resp: 18    Complications: No apparent anesthesia complications

## 2015-01-26 NOTE — Addendum Note (Signed)
Addendum  created 01/26/15 1606 by Angela Adam, CRNA   Modules edited: Charges VN

## 2015-01-26 NOTE — Anesthesia Procedure Notes (Signed)
Epidural Patient location during procedure: OR  Staffing Anesthesiologist: Avyonna Wagoner Performed by: anesthesiologist   Preanesthetic Checklist Completed: patient identified, site marked, surgical consent, pre-op evaluation, timeout performed, IV checked, risks and benefits discussed and monitors and equipment checked  Epidural Patient position: sitting Prep: site prepped and draped and DuraPrep Patient monitoring: continuous pulse ox and blood pressure Approach: midline Location: L3-L4 Injection technique: LOR saline  Needle:  Needle type: Tuohy  Needle gauge: 17 G Needle length: 9 cm and 9 Needle insertion depth: 10 cm Catheter type: closed end flexible Catheter size: 19 Gauge Catheter at skin depth: 15 cm Test dose: negative  Assessment Events: blood not aspirated, injection not painful, no injection resistance, negative IV test and no paresthesia  Additional Notes Patient identified. Risks/Benefits/Options discussed with patient including but not limited to bleeding, infection, nerve damage, paralysis, failed block, incomplete pain control, headache, blood pressure changes, nausea, vomiting, reactions to medication both or allergic, itching and postpartum back pain. Confirmed with bedside nurse the patient's most recent platelet count. Confirmed with patient that they are not currently taking any anticoagulation, have any bleeding history or any family history of bleeding disorders. Patient expressed understanding and wished to proceed. All questions were answered. Sterile technique was used throughout the entire procedure. Please see nursing notes for vital signs. Test dose was given through epidural catheter and negative.  This was a CSE. After LOR obtained, a 27 ga spinal needle was passed through the tuohy and clear CSF return. Spinal dose given and needle withdrawn. Epidural catheter passed easily and taped.

## 2015-01-27 LAB — CBC
HEMATOCRIT: 24.5 % — AB (ref 36.0–46.0)
HEMOGLOBIN: 7.7 g/dL — AB (ref 12.0–15.0)
MCH: 27 pg (ref 26.0–34.0)
MCHC: 31.4 g/dL (ref 30.0–36.0)
MCV: 86 fL (ref 78.0–100.0)
PLATELETS: 281 10*3/uL (ref 150–400)
RBC: 2.85 MIL/uL — ABNORMAL LOW (ref 3.87–5.11)
RDW: 14.7 % (ref 11.5–15.5)
WBC: 15.1 10*3/uL — ABNORMAL HIGH (ref 4.0–10.5)

## 2015-01-27 MED ORDER — FERROUS SULFATE 325 (65 FE) MG PO TABS
325.0000 mg | ORAL_TABLET | Freq: Two times a day (BID) | ORAL | Status: DC
  Administered 2015-01-27 – 2015-01-28 (×3): 325 mg via ORAL
  Filled 2015-01-27 (×3): qty 1

## 2015-01-27 NOTE — Progress Notes (Signed)
POSTPARTUM PROGRESS NOTE  Post Partum Day 1 Subjective:  Felicia Hicks is a 24 y.o. G2P2002 [redacted]w[redacted]d s/p RLTCS/BTL.  No acute events overnight.  Pt denies problems with ambulating, voiding or po intake.  She denies nausea or vomiting.  Pain is well controlled.  She has had flatus. She has not had bowel movement.  Denies SOB or DOE. Lochia Small.  Plan for birth control is s/p BTL .  Method of Feeding: breast  Objective: Blood pressure 106/71, pulse 87, temperature 97.8 F (36.6 C), temperature source Axillary, resp. rate 18, last menstrual period 05/22/2014, SpO2 100 %, unknown if currently breastfeeding.  Physical Exam:  General: alert, cooperative and no distress Lochia:normal flow Chest: CTAB Heart: RRR no m/r/g Abdomen: +BS, soft, nontender, incision site bandaged and dry Uterine Fundus: firm DVT Evaluation: No evidence of DVT seen on physical exam. Extremities: trace edema   Recent Labs  01/27/15 0510  HGB 7.7*  HCT 24.5*    Assessment/Plan:  ASSESSMENT: Felicia Hicks is a 24 y.o. G2P2002 [redacted]w[redacted]d s/p RLTCS and BTL  - d/c likely tomorrow - start ferrous sulfate 325 mg po bid given drop in H from 10.5 to 7.7; will repeat CBC tomorrow morning and plan to transfuse if H < 7 or if patient develops symptoms   LOS: 1 day   Silvano Bilis 01/27/2015, 7:38 AM

## 2015-01-27 NOTE — Progress Notes (Signed)
CLINICAL SOCIAL WORK MATERNAL/CHILD NOTE  Patient Details  Name: Girl Valeen Borys MRN: [id number] Date of Birth: 01/26/2017  Date: 01/27/2015  Clinical Social Worker Initiating Note: Markell Sciascia, LCSWDate/ Time Initiated: 01/27/15/1100   Child's Name: Novella Rob   Legal Guardian:  (Parents Gaynell Face and Eyvonne Mechanic)   Need for Interpreter: None   Date of Referral: 01/26/15   Reason for Referral: Other (Comment)   Referral Source: [facility]   Address: [address] [place], [place] [address]  Phone number:     Household Members: Significant Other, Minor Children   Natural Supports (not living in the home): Extended Family, Immediate Family   Professional Supports:None   Employment: (Both parents are employed)   Type of Work:     Education:     Museum/gallery curator Resources:Medicaid   Other Resources: Physicist, medical , [place] Considerations Which May Impact Care: none noted  Strengths: Ability to meet basic needs , Home prepared for child , Pediatrician chosen    Risk Factors/Current Problems:  (hx of marijuana use)   Cognitive State: Alert , Able to Concentrate    Mood/Affect: Interested , Happy    CSW Assessment: Acknowledged order for Social Work consult to assess mother's history of marijuana. Met with both parents. They are not married and reside together. Mother has one other dependent age 2. Mother acknowledged hx of marijuana. She reports using occasionally during pregnancy to stimulate her appetite. She denies any need for treatment. She denies any other illicit drug use. Mother denies any hx of mental illness or PP Depression. She was informed of the hospital's drug screen policy. UDS on newborn is negative. Mother informed of social work Fish farm manager.   CSW Plan/Description:    No current barriers to discharge Will continue to monitor drug  screen  Barron Vanloan J, LCSW 01/27/2015, 2:01 PM

## 2015-01-27 NOTE — Anesthesia Postprocedure Evaluation (Signed)
  Anesthesia Post-op Note  Patient: Margaretha Seeds  Procedure(s) Performed: Procedure(s) with comments: CESAREAN SECTION WITH BILATERAL TUBAL LIGATION (Bilateral) - requesting 01/26/15 @ 9:00a Sherlyn Lick Confirmed with Dr. Debroah Loop to RNFA on 01/20/2015  Patient Location: Mother/Baby  Anesthesia Type:Spinal  Level of Consciousness: awake and alert   Airway and Oxygen Therapy: Patient Spontanous Breathing  Post-op Pain: mild  Post-op Assessment: Post-op Vital signs reviewed, Patient's Cardiovascular Status Stable, Respiratory Function Stable, No signs of Nausea or vomiting, Pain level controlled, No headache, Spinal receding and Patient able to bend at knees    Post-op Vital Signs: Reviewed  Last Vitals:  Filed Vitals:   01/27/15 0400  BP: 106/71  Pulse: 87  Temp: 36.6 C  Resp:     Complications: No apparent anesthesia complications

## 2015-01-27 NOTE — Addendum Note (Signed)
Addendum  created 01/27/15 1610 by Angela Adam, CRNA   Modules edited: Notes Section   Notes Section:  File: [id number]

## 2015-01-28 ENCOUNTER — Encounter (HOSPITAL_COMMUNITY): Payer: Self-pay | Admitting: Obstetrics & Gynecology

## 2015-01-28 LAB — CBC
HCT: 23.9 % — ABNORMAL LOW (ref 36.0–46.0)
Hemoglobin: 7.5 g/dL — ABNORMAL LOW (ref 12.0–15.0)
MCH: 26.9 pg (ref 26.0–34.0)
MCHC: 31.4 g/dL (ref 30.0–36.0)
MCV: 85.7 fL (ref 78.0–100.0)
Platelets: 298 10*3/uL (ref 150–400)
RBC: 2.79 MIL/uL — ABNORMAL LOW (ref 3.87–5.11)
RDW: 14.8 % (ref 11.5–15.5)
WBC: 15.1 10*3/uL — AB (ref 4.0–10.5)

## 2015-01-28 MED ORDER — FERROUS SULFATE 325 (65 FE) MG PO TABS: 325.0000 mg | ORAL_TABLET | Freq: Two times a day (BID) | ORAL | Status: DC

## 2015-01-28 MED ORDER — OXYCODONE-ACETAMINOPHEN 5-325 MG PO TABS: 1.0000 | ORAL_TABLET | Freq: Four times a day (QID) | ORAL | Status: DC | PRN

## 2015-01-28 MED ORDER — BISACODYL 5 MG PO TBEC: 5.0000 mg | DELAYED_RELEASE_TABLET | Freq: Every day | ORAL | Status: DC | PRN

## 2015-01-28 NOTE — Discharge Instructions (Signed)
Postpartum Care After Vaginal Delivery °After you deliver your newborn (postpartum period), the usual stay in the hospital is 24-72 hours. If there were problems with your labor or delivery, or if you have other medical problems, you might be in the hospital longer.  °While you are in the hospital, you will receive help and instructions on how to care for yourself and your newborn during the postpartum period.  °While you are in the hospital: °· Be sure to tell your nurses if you have pain or discomfort, as well as where you feel the pain and what makes the pain worse. °· If you had an incision made near your vagina (episiotomy) or if you had some tearing during delivery, the nurses may put ice packs on your episiotomy or tear. The ice packs may help to reduce the pain and swelling. °· If you are breastfeeding, you may feel uncomfortable contractions of your uterus for a couple of weeks. This is normal. The contractions help your uterus get back to normal size. °· It is normal to have some bleeding after delivery. °· For the first 1-3 days after delivery, the flow is red and the amount may be similar to a period. °· It is common for the flow to start and stop. °· In the first few days, you may pass some small clots. Let your nurses know if you begin to pass large clots or your flow increases. °· Do not  flush blood clots down the toilet before having the nurse look at them. °· During the next 3-10 days after delivery, your flow should become more watery and pink or brown-tinged in color. °· Ten to fourteen days after delivery, your flow should be a small amount of yellowish-white discharge. °· The amount of your flow will decrease over the first few weeks after delivery. Your flow may stop in 6-8 weeks. Most women have had their flow stop by 12 weeks after delivery. °· You should change your sanitary pads frequently. °· Wash your hands thoroughly with soap and water for at least 20 seconds after changing pads, using  the toilet, or before holding or feeding your newborn. °· You should feel like you need to empty your bladder within the first 6-8 hours after delivery. °· In case you become weak, lightheaded, or faint, call your nurse before you get out of bed for the first time and before you take a shower for the first time. °· Within the first few days after delivery, your breasts may begin to feel tender and full. This is called engorgement. Breast tenderness usually goes away within 48-72 hours after engorgement occurs. You may also notice milk leaking from your breasts. If you are not breastfeeding, do not stimulate your breasts. Breast stimulation can make your breasts produce more milk. °· Spending as much time as possible with your newborn is very important. During this time, you and your newborn can feel close and get to know each other. Having your newborn stay in your room (rooming in) will help to strengthen the bond with your newborn.  It will give you time to get to know your newborn and become comfortable caring for your newborn. °· Your hormones change after delivery. Sometimes the hormone changes can temporarily cause you to feel sad or tearful. These feelings should not last more than a few days. If these feelings last longer than that, you should talk to your caregiver. °· If desired, talk to your caregiver about methods of family planning or contraception. °·   Talk to your caregiver about immunizations. Your caregiver may want you to have the following immunizations before leaving the hospital: °· Tetanus, diphtheria, and pertussis (Tdap) or tetanus and diphtheria (Td) immunization. It is very important that you and your family (including grandparents) or others caring for your newborn are up-to-date with the Tdap or Td immunizations. The Tdap or Td immunization can help protect your newborn from getting ill. °· Rubella immunization. °· Varicella (chickenpox) immunization. °· Influenza immunization. You should  receive this annual immunization if you did not receive the immunization during your pregnancy. °Document Released: 04/05/2007 Document Revised: 03/02/2012 Document Reviewed: 02/03/2012 °ExitCare® Patient Information ©2015 [employer]. This information is not intended to replace advice given to you by your health care provider. Make sure you discuss any questions you have with your health care provider. ° ° °Iron-Rich Diet °An iron-rich diet contains foods that are good sources of iron. Iron is an important mineral that helps your body produce hemoglobin. Hemoglobin is a protein in red blood cells that carries oxygen to the body's tissues. Sometimes, the iron level in your blood can be low. This may be caused by: °· A lack of iron in your diet. °· Blood loss. °· Times of growth, such as during pregnancy or during a child's growth and development. °Low levels of iron can cause a decrease in the number of red blood cells. This can result in iron deficiency anemia. Iron deficiency anemia symptoms include: °· Tiredness. °· Weakness. °· Irritability. °· Increased chance of infection. °Here are some recommendations for daily iron intake: °· Males older than 26 years of age need 8 mg of iron per day. °· Women ages 19 to 50 need 18 mg of iron per day. °· Pregnant women need 27 mg of iron per day, and women who are over 19 years of age and breastfeeding need 9 mg of iron per day. °· Women over the age of 50 need 8 mg of iron per day. °SOURCES OF IRON °There are 2 types of iron that are found in food: heme iron and nonheme iron. Heme iron is absorbed by the body better than nonheme iron. Heme iron is found in meat, poultry, and fish. Nonheme iron is found in grains, beans, and vegetables. °Heme Iron Sources °Food / Iron (mg) °· Chicken liver, 3 oz (85 g)/ 10 mg °· Beef liver, 3 oz (85 g)/ 5.5 mg °· Oysters, 3 oz (85 g)/ 8 mg °· Beef, 3 oz (85 g)/ 2 to 3 mg °· Shrimp, 3 oz (85 g)/ 2.8 mg °· Turkey, 3 oz (85 g)/ 2  mg °· Chicken, 3 oz (85 g) / 1 mg °· Fish (tuna, halibut), 3 oz (85 g)/ 1 mg °· Pork, 3 oz (85 g)/ 0.9 mg °Nonheme Iron Sources °Food / Iron (mg) °· Ready-to-eat breakfast cereal, iron-fortified / 3.9 to 7 mg °· Tofu, ½ cup / 3.4 mg °· Kidney beans, ½ cup / 2.6 mg °· Baked potato with skin / 2.7 mg °· Asparagus, ½ cup / 2.2 mg °· Avocado / 2 mg °· Dried peaches, ½ cup / 1.6 mg °· Raisins, ½ cup / 1.5 mg °· Soy milk, 1 cup / 1.5 mg °· Whole-wheat bread, 1 slice / 1.2 mg °· Spinach, 1 cup / 0.8 mg °· Broccoli, ½ cup / 0.6 mg °IRON ABSORPTION °Certain foods can decrease the body's absorption of iron. Try to avoid these foods and beverages while eating meals with iron-containing foods: °· Coffee. °· Tea. °·   Fiber. °· Soy. °Foods containing vitamin C can help increase the amount of iron your body absorbs from iron sources, especially from nonheme sources. Eat foods with vitamin C along with iron-containing foods to increase your iron absorption. Foods that are high in vitamin C include many fruits and vegetables. Some good sources are: °· Fresh orange juice. °· Oranges. °· Strawberries. °· Mangoes. °· Grapefruit. °· Red bell peppers. °· Green bell peppers. °· Broccoli. °· Potatoes with skin. °· Tomato juice. °Document Released: 01/20/2005 Document Revised: 08/31/2011 Document Reviewed: 11/27/2010 °ExitCare® Patient Information ©2015 [employer]. This information is not intended to replace advice given to you by your health care provider. Make sure you discuss any questions you have with your health care provider. ° °

## 2015-01-28 NOTE — Discharge Summary (Signed)
Obstetric Discharge Summary Reason for Admission: cesarean section Prenatal Procedures: none Intrapartum Procedures: tubal ligation Postpartum Procedures: none Complications-Operative and Postpartum: none  Hospital Course:  Active Problems:   S/P cesarean section   Labor without complication   Felicia Hicks is a 24 y.o. G2P2002 s/p rLTCS.  Patient was admitted 8/6.  She has postpartum course that was uncomplicated including no problems with ambulating, PO intake, urination, pain, or bleeding. The pt feels ready to go home and  will be discharged with outpatient follow-up.   Today: No acute events overnight.  Pt denies problems with ambulating, voiding or po intake.  She denies nausea or vomiting.  Pain is well controlled.  She has had flatus. She has not had bowel movement.  Lochia Minimal.  Plan for birth control is  bilateral tubal ligation.  Method of Feeding: bottle  Physical Exam:  General: alert, cooperative, appears stated age and no distress Lochia: appropriate Uterine Fundus: firm Incision: healing well, no significant drainage, no dehiscence, no significant erythema DVT Evaluation: No evidence of DVT seen on physical exam.  H/H: Lab Results  Component Value Date/Time   HGB 7.5* 01/28/2015 05:24 AM   HCT 23.9* 01/28/2015 05:24 AM    Discharge Diagnoses: Term Pregnancy-delivered  Discharge Information: Date: 01/28/2015 Activity: pelvic rest Diet: routine  Medications: PNV, Iron and Percocet Breast feeding:  No: bottle Condition: stable Instructions: refer to handout Discharge to: home       Discharge Instructions    Discharge patient    Complete by:  As directed             Medication List    TAKE these medications        calcium carbonate 500 MG chewable tablet  Commonly known as:  TUMS - dosed in mg elemental calcium  Chew 1 tablet by mouth as needed for indigestion or heartburn. Patient takes five times daily as needed.     ferrous sulfate 325  (65 FE) MG tablet  Take 1 tablet (325 mg total) by mouth 2 (two) times daily with a meal.     oxyCODONE-acetaminophen 5-325 MG per tablet  Commonly known as:  PERCOCET/ROXICET  Take 1 tablet by mouth every 6 (six) hours as needed for moderate pain or severe pain.     prenatal multivitamin Tabs tablet  Take 1 tablet by mouth daily.       Follow-up Information    Follow up with [facility]. Schedule an appointment as soon as possible for a visit in 6 weeks.   Specialty:  Home Health Services   Why:  for postpartum visit   Contact information:   Care Coordination for [facility] [address] [place] [address] [phone]       Lowanda Foster ,MD 01/28/2015,6:20 AM

## 2015-01-30 ENCOUNTER — Telehealth: Payer: Self-pay | Admitting: General Practice

## 2015-01-30 NOTE — Telephone Encounter (Signed)
Patient called and left message stating she had a baby recently and cannot go to the bathroom and would like to know what she can take. Called patient, no answer- left message stating we are trying to reach you to return your phone call, please call us back at the clinics. Per chart review, patient was seen at HD during pregnancy

## 2015-01-31 NOTE — Telephone Encounter (Signed)
Contacted patient, pt states she did not receive 2 prescriptions from the Pharmacy at [facility].  Contacted the pharmacy, the 2 medications are over the counter and not filled by the pharmacy.  Contacted patient and gave updated information. Pt verbalizes understanding and has no further questions.

## 2015-02-11 ENCOUNTER — Telehealth: Payer: Self-pay

## 2015-02-11 DIAGNOSIS — G8918 Other acute postprocedural pain: Secondary | ICD-10-CM

## 2015-02-11 NOTE — Telephone Encounter (Signed)
Pt called and I was unable to clearly hear message due to voice going in and out.

## 2015-02-12 MED ORDER — IBUPROFEN 600 MG PO TABS: 600.0000 mg | ORAL_TABLET | Freq: Four times a day (QID) | ORAL | Status: AC | PRN

## 2015-02-12 NOTE — Telephone Encounter (Signed)
Patient called and left message stating she needs a refill. Called patient back and she states she needs a refill on her percocet. Told patient this far out from surgery we would recommended ibuprofen to control pain. Obtained Rx for ibuprofen  from Dr Debroah Loop. Informed patient of Rx sent to pharmacy. Patient verbalized understanding and states she has had a very very bad smell come from her incision and a white mucous d/c. Told patient she needs to come in for evaluation as she could have an infection. Patient states she doesn't have money for gas to get here. Recommended to patient she ask if a friend or family member can take her and if they cannot during our office hours, she should go to MAU here at the hospital for evaluation. Patient verbalized understanding and will call back about coming in for nurse visit. Patient had no other questions

## 2015-03-22 ENCOUNTER — Encounter: Payer: Self-pay | Admitting: *Deleted

## 2019-08-21 ENCOUNTER — Ambulatory Visit: Payer: Self-pay | Attending: Internal Medicine

## 2020-11-26 ENCOUNTER — Ambulatory Visit (INDEPENDENT_AMBULATORY_CARE_PROVIDER_SITE_OTHER): Payer: Self-pay

## 2020-11-26 ENCOUNTER — Ambulatory Visit
Admission: EM | Admit: 2020-11-26 | Discharge: 2020-11-26 | Disposition: A | Discharge status: Home / Self Care | Payer: Self-pay | Attending: Emergency Medicine | Admitting: Emergency Medicine

## 2020-11-26 DIAGNOSIS — S8000XA Contusion of unspecified knee, initial encounter: Secondary | ICD-10-CM

## 2020-11-26 DIAGNOSIS — S161XXA Strain of muscle, fascia and tendon at neck level, initial encounter: Secondary | ICD-10-CM

## 2020-11-26 DIAGNOSIS — M79671 Pain in right foot: Secondary | ICD-10-CM

## 2020-11-26 DIAGNOSIS — M542 Cervicalgia: Secondary | ICD-10-CM

## 2020-11-26 DIAGNOSIS — S20219A Contusion of unspecified front wall of thorax, initial encounter: Secondary | ICD-10-CM
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
## 2020-11-26 MED ORDER — IBUPROFEN 800 MG PO TABS: 800.0000 mg | ORAL_TABLET | Freq: Once | ORAL | Status: DC

## 2020-11-26 MED ORDER — NAPROXEN 500 MG PO TABS: 500.0000 mg | ORAL_TABLET | Freq: Two times a day (BID) | ORAL | 0 refills | Status: AC

## 2020-11-26 MED ORDER — TIZANIDINE HCL 2 MG PO TABS: 2.0000 mg | ORAL_TABLET | Freq: Four times a day (QID) | ORAL | 0 refills | Status: AC | PRN
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
## 2020-11-26 NOTE — Discharge Instructions (Addendum)
Naprosyn twice daily with food Supplement tizanidine at home/bedtime Alternate ice and heat Gentle stretching Follow-up if not improving or worsening

## 2020-11-27 ENCOUNTER — Emergency Department (HOSPITAL_COMMUNITY): Payer: No Typology Code available for payment source

## 2020-11-27 ENCOUNTER — Other Ambulatory Visit: Payer: Self-pay

## 2020-11-27 ENCOUNTER — Encounter (HOSPITAL_COMMUNITY): Payer: Self-pay

## 2020-11-27 ENCOUNTER — Emergency Department (HOSPITAL_COMMUNITY)
Admission: EM | Admit: 2020-11-27 | Discharge: 2020-11-27 | Disposition: A | Discharge status: Home / Self Care | Payer: No Typology Code available for payment source | Attending: Emergency Medicine | Admitting: Emergency Medicine

## 2020-11-27 DIAGNOSIS — M79671 Pain in right foot: Secondary | ICD-10-CM | POA: Diagnosis not present

## 2020-11-27 DIAGNOSIS — R0789 Other chest pain: Secondary | ICD-10-CM | POA: Insufficient documentation

## 2020-11-27 DIAGNOSIS — M542 Cervicalgia: Secondary | ICD-10-CM | POA: Insufficient documentation

## 2020-11-27 DIAGNOSIS — S4992XA Unspecified injury of left shoulder and upper arm, initial encounter: Secondary | ICD-10-CM | POA: Diagnosis present

## 2020-11-27 DIAGNOSIS — S8001XA Contusion of right knee, initial encounter: Secondary | ICD-10-CM | POA: Insufficient documentation

## 2020-11-27 DIAGNOSIS — Y92410 Unspecified street and highway as the place of occurrence of the external cause: Secondary | ICD-10-CM | POA: Insufficient documentation

## 2020-11-27 DIAGNOSIS — F17210 Nicotine dependence, cigarettes, uncomplicated: Secondary | ICD-10-CM | POA: Diagnosis not present

## 2020-11-27 DIAGNOSIS — S46812A Strain of other muscles, fascia and tendons at shoulder and upper arm level, left arm, initial encounter: Secondary | ICD-10-CM | POA: Insufficient documentation
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
## 2020-11-27 MED ORDER — LIDOCAINE 5 % EX PTCH: 1.0000 | MEDICATED_PATCH | CUTANEOUS | 0 refills | Status: AC
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
## 2020-11-27 NOTE — Discharge Instructions (Signed)
At this time there does not appear to be the presence of an emergent medical condition, however there is always the potential for conditions to change. Please read and follow the below instructions.  Please return to the Emergency Department immediately for any new or worsening symptoms. Please be sure to follow up with your Primary Care Provider within one week regarding your visit today; please call their office to schedule an appointment even if you are feeling better for a follow-up visit. Please call the on-call orthopedic specialist Dr. Jena Gauss for further evaluation and treatment of your right knee pain, right heel pain and left trapezius muscle spasm.  Please use the knee brace given to you today to protect your right knee from further injury.  Please use the crutches to avoid placing weight onto your right leg until you are evaluated and cleared by the orthopedic specialist.  Please use rest ice and elevation to help with pain and swelling.  You may use the muscle relaxer and anti-inflammatory given to you by the urgent care to help with your symptoms.  Be sure not to drive, drink alcohol or perform any potentially dangerous activities while taking the muscle relaxer as it could make you drowsy. You may use the Lidoderm patch as prescribed to help with your symptoms.  Lidoderm may be expensive so you may speak with your pharmacist about finding over-the-counter medications that work similarly.  Go to the nearest Emergency Department immediately if: You have fever or chills You have: Loss of feeling (numbness), tingling, or weakness in your arms or legs. Very bad neck pain, especially tenderness in the middle of the back of your neck. A change in your ability to control your pee or poop (stool). More pain in any area of your body. Swelling in any area of your body, especially your legs. Shortness of breath or light-headedness. Chest pain. Blood in your pee, poop, or vomit. Very bad pain in  your belly (abdomen) or your back. Very bad headaches or headaches that are getting worse. Sudden vision loss or double vision. Your eye suddenly turns red. The black center of your eye (pupil) is an odd shape or size. You have any new/concerning or worsening of symptoms.   Please read the additional information packets attached to your discharge summary.  Do not take your medicine if  develop an itchy rash, swelling in your mouth or lips, or difficulty breathing; call 911 and seek immediate emergency medical attention if this occurs.  You may review your lab tests and imaging results in their entirety on your MyChart account.  Please discuss all results of fully with your primary care provider and other specialist at your follow-up visit.  Note: Portions of this text may have been transcribed using voice recognition software. Every effort was made to ensure accuracy; however, inadvertent computerized transcription errors may still be present.
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
## 2020-11-27 NOTE — Progress Notes (Signed)
Orthopedic Tech Progress Note Patient Details:  Felicia Hicks 1991/05/14 [id number] Patient refused CRUTCHES, notified PA Ortho Devices Type of Ortho Device: Knee Sleeve Ortho Device/Splint Location: RLE Ortho Device/Splint Interventions: Ordered,Application,Adjustment   Post Interventions Patient Tolerated: Well Instructions Provided: Care of device   Donald Pore 11/27/2020, 2:17 PM
